# Patient Record
Sex: Male | Born: 1935 | Race: White | Hispanic: No | Smoking: Current every day smoker
Health system: Southern US, Community
[De-identification: ages and names within clinical notes are randomized; demographics above are authoritative.]

## PROBLEM LIST (undated history)

## (undated) DIAGNOSIS — Z923 Personal history of irradiation: Secondary | ICD-10-CM

## (undated) DIAGNOSIS — Z720 Tobacco use: Secondary | ICD-10-CM

## (undated) DIAGNOSIS — I2510 Atherosclerotic heart disease of native coronary artery without angina pectoris: Secondary | ICD-10-CM

## (undated) DIAGNOSIS — I714 Abdominal aortic aneurysm, without rupture, unspecified: Secondary | ICD-10-CM

## (undated) DIAGNOSIS — I739 Peripheral vascular disease, unspecified: Secondary | ICD-10-CM

## (undated) DIAGNOSIS — R590 Localized enlarged lymph nodes: Secondary | ICD-10-CM

## (undated) DIAGNOSIS — I6529 Occlusion and stenosis of unspecified carotid artery: Secondary | ICD-10-CM

## (undated) DIAGNOSIS — Z9289 Personal history of other medical treatment: Secondary | ICD-10-CM

## (undated) DIAGNOSIS — E785 Hyperlipidemia, unspecified: Secondary | ICD-10-CM

## (undated) DIAGNOSIS — I10 Essential (primary) hypertension: Secondary | ICD-10-CM

## (undated) DIAGNOSIS — I219 Acute myocardial infarction, unspecified: Secondary | ICD-10-CM

## (undated) DIAGNOSIS — J449 Chronic obstructive pulmonary disease, unspecified: Secondary | ICD-10-CM

## (undated) DIAGNOSIS — I701 Atherosclerosis of renal artery: Secondary | ICD-10-CM

## (undated) DIAGNOSIS — C801 Malignant (primary) neoplasm, unspecified: Secondary | ICD-10-CM

## (undated) DIAGNOSIS — E041 Nontoxic single thyroid nodule: Secondary | ICD-10-CM

## (undated) HISTORY — DX: Tobacco use: Z72.0

## (undated) HISTORY — DX: Personal history of other medical treatment: Z92.89

## (undated) HISTORY — DX: Peripheral vascular disease, unspecified: I73.9

## (undated) HISTORY — DX: Atherosclerotic heart disease of native coronary artery without angina pectoris: I25.10

## (undated) HISTORY — DX: Malignant (primary) neoplasm, unspecified: C80.1

## (undated) HISTORY — DX: Atherosclerosis of renal artery: I70.1

## (undated) HISTORY — DX: Localized enlarged lymph nodes: R59.0

## (undated) HISTORY — DX: Nontoxic single thyroid nodule: E04.1

## (undated) HISTORY — DX: Acute myocardial infarction, unspecified: I21.9

## (undated) HISTORY — PX: TONSILLECTOMY: SUR1361

## (undated) HISTORY — DX: Essential (primary) hypertension: I10

## (undated) HISTORY — DX: Abdominal aortic aneurysm, without rupture: I71.4

## (undated) HISTORY — DX: Occlusion and stenosis of unspecified carotid artery: I65.29

## (undated) HISTORY — PX: OTHER SURGICAL HISTORY: SHX169

## (undated) HISTORY — DX: Hyperlipidemia, unspecified: E78.5

## (undated) HISTORY — DX: Personal history of irradiation: Z92.3

## (undated) HISTORY — DX: Abdominal aortic aneurysm, without rupture, unspecified: I71.40

## (undated) HISTORY — DX: Chronic obstructive pulmonary disease, unspecified: J44.9

---

## 2004-06-01 DIAGNOSIS — I219 Acute myocardial infarction, unspecified: Secondary | ICD-10-CM

## 2004-06-01 HISTORY — DX: Acute myocardial infarction, unspecified: I21.9

## 2004-06-08 ENCOUNTER — Ambulatory Visit: Payer: Self-pay | Admitting: Cardiology

## 2004-06-08 ENCOUNTER — Inpatient Hospital Stay (HOSPITAL_COMMUNITY): Admission: EM | Admit: 2004-06-08 | Discharge: 2004-06-11 | Payer: Self-pay | Admitting: Emergency Medicine

## 2004-07-12 ENCOUNTER — Ambulatory Visit: Payer: Self-pay

## 2004-07-19 ENCOUNTER — Ambulatory Visit: Payer: Self-pay

## 2004-07-19 ENCOUNTER — Ambulatory Visit: Payer: Self-pay | Admitting: Internal Medicine

## 2004-09-07 ENCOUNTER — Ambulatory Visit: Payer: Self-pay

## 2004-09-23 ENCOUNTER — Ambulatory Visit: Payer: Self-pay | Admitting: Internal Medicine

## 2004-12-21 ENCOUNTER — Ambulatory Visit: Payer: Self-pay | Admitting: Internal Medicine

## 2005-02-24 ENCOUNTER — Ambulatory Visit: Payer: Self-pay | Admitting: Internal Medicine

## 2005-05-24 ENCOUNTER — Ambulatory Visit: Payer: Self-pay | Admitting: Internal Medicine

## 2005-09-02 ENCOUNTER — Ambulatory Visit: Payer: Self-pay | Admitting: Internal Medicine

## 2005-09-20 ENCOUNTER — Ambulatory Visit: Payer: Self-pay

## 2005-12-30 ENCOUNTER — Ambulatory Visit: Payer: Self-pay | Admitting: Internal Medicine

## 2006-07-21 ENCOUNTER — Ambulatory Visit: Payer: Self-pay | Admitting: Internal Medicine

## 2006-07-28 ENCOUNTER — Ambulatory Visit: Payer: Self-pay | Admitting: Internal Medicine

## 2006-10-23 ENCOUNTER — Ambulatory Visit: Payer: Self-pay

## 2007-01-08 ENCOUNTER — Ambulatory Visit: Payer: Self-pay | Admitting: Internal Medicine

## 2007-01-16 ENCOUNTER — Ambulatory Visit: Payer: Self-pay | Admitting: Internal Medicine

## 2007-01-26 ENCOUNTER — Ambulatory Visit: Payer: Self-pay | Admitting: Internal Medicine

## 2007-01-26 ENCOUNTER — Ambulatory Visit: Payer: Self-pay

## 2007-01-26 ENCOUNTER — Ambulatory Visit: Payer: Self-pay | Admitting: Cardiology

## 2007-01-26 LAB — CONVERTED CEMR LAB
ALT: 20 units/L (ref 0–53)
AST: 27 units/L (ref 0–37)
Albumin: 3.8 g/dL (ref 3.5–5.2)
Basophils Absolute: 0 10*3/uL (ref 0.0–0.1)
Bilirubin, Direct: 0.1 mg/dL (ref 0.0–0.3)
Calcium: 9.4 mg/dL (ref 8.4–10.5)
Chloride: 112 meq/L (ref 96–112)
Cholesterol: 118 mg/dL (ref 0–200)
Eosinophils Absolute: 0.2 10*3/uL (ref 0.0–0.6)
GFR calc Af Amer: 77 mL/min
GFR calc non Af Amer: 63 mL/min
Glucose, Bld: 99 mg/dL (ref 70–99)
HDL: 48.4 mg/dL (ref >39.0)
LDL Cholesterol: 58 mg/dL (ref 0–99)
Lymphocytes Relative: 36.1 % (ref 12.0–46.0)
MCHC: 33.6 g/dL (ref 30.0–36.0)
MCV: 97 fL (ref 78.0–100.0)
Monocytes Relative: 6.6 % (ref 3.0–11.0)
Neutro Abs: 4.6 10*3/uL (ref 1.4–7.7)
Platelets: 174 10*3/uL (ref 150–400)
RBC: 4.42 M/uL (ref 4.22–5.81)
Total CHOL/HDL Ratio: 2.4
Triglycerides: 57 mg/dL (ref 0–149)
VLDL: 11 mg/dL (ref 0–40)
aPTT: 28.9 s (ref 26.5–36.5)

## 2007-02-06 ENCOUNTER — Ambulatory Visit: Payer: Self-pay | Admitting: Internal Medicine

## 2007-02-08 ENCOUNTER — Ambulatory Visit (HOSPITAL_COMMUNITY): Admission: RE | Admit: 2007-02-08 | Discharge: 2007-02-08 | Payer: Self-pay | Admitting: Internal Medicine

## 2007-02-14 LAB — CBC WITH DIFFERENTIAL/PLATELET
BASO%: 0.5 % (ref 0.0–2.0)
MCHC: 35.1 g/dL (ref 32.0–35.9)
MONO#: 0.7 10*3/uL (ref 0.1–0.9)
RBC: 4.27 10*6/uL (ref 4.20–5.71)
RDW: 14.2 % (ref 11.2–14.6)
WBC: 7.5 10*3/uL (ref 4.0–10.0)
lymph#: 2.9 10*3/uL (ref 0.9–3.3)

## 2007-02-14 LAB — COMPREHENSIVE METABOLIC PANEL
ALT: 16 U/L (ref 0–53)
CO2: 29 mEq/L (ref 19–32)
Calcium: 9.6 mg/dL (ref 8.4–10.5)
Chloride: 106 mEq/L (ref 96–112)
Potassium: 4.2 mEq/L (ref 3.5–5.3)
Sodium: 143 mEq/L (ref 135–145)
Total Bilirubin: 0.4 mg/dL (ref 0.3–1.2)
Total Protein: 7 g/dL (ref 6.0–8.3)

## 2007-02-21 ENCOUNTER — Ambulatory Visit (HOSPITAL_COMMUNITY): Admission: RE | Admit: 2007-02-21 | Discharge: 2007-02-21 | Payer: Self-pay | Admitting: Internal Medicine

## 2007-02-27 ENCOUNTER — Ambulatory Visit: Payer: Self-pay | Admitting: Thoracic Surgery (Cardiothoracic Vascular Surgery)

## 2007-03-06 ENCOUNTER — Ambulatory Visit (HOSPITAL_COMMUNITY)
Admission: RE | Admit: 2007-03-06 | Discharge: 2007-03-06 | Payer: Self-pay | Admitting: Thoracic Surgery (Cardiothoracic Vascular Surgery)

## 2007-03-07 ENCOUNTER — Ambulatory Visit: Payer: Self-pay | Admitting: Thoracic Surgery (Cardiothoracic Vascular Surgery)

## 2007-03-13 ENCOUNTER — Encounter (INDEPENDENT_AMBULATORY_CARE_PROVIDER_SITE_OTHER): Payer: Self-pay | Admitting: Interventional Radiology

## 2007-03-13 ENCOUNTER — Other Ambulatory Visit: Admission: RE | Admit: 2007-03-13 | Discharge: 2007-03-13 | Payer: Self-pay | Admitting: Interventional Radiology

## 2007-03-13 ENCOUNTER — Encounter: Admission: RE | Admit: 2007-03-13 | Discharge: 2007-03-13 | Payer: Self-pay | Admitting: Internal Medicine

## 2007-03-13 HISTORY — PX: BIOPSY THYROID: PRO38

## 2007-03-16 ENCOUNTER — Ambulatory Visit: Admission: RE | Admit: 2007-03-16 | Discharge: 2007-06-14 | Payer: Self-pay | Admitting: Radiation Oncology

## 2007-03-27 ENCOUNTER — Ambulatory Visit: Payer: Self-pay | Admitting: Internal Medicine

## 2007-03-29 LAB — CBC WITH DIFFERENTIAL/PLATELET
BASO%: 0.3 % (ref 0.0–2.0)
LYMPH%: 40 % (ref 14.0–48.0)
MCHC: 34.7 g/dL (ref 32.0–35.9)
MONO#: 0.5 10*3/uL (ref 0.1–0.9)
Platelets: 196 10*3/uL (ref 145–400)
RBC: 4.37 10*6/uL (ref 4.20–5.71)
WBC: 7.3 10*3/uL (ref 4.0–10.0)

## 2007-03-29 LAB — COMPREHENSIVE METABOLIC PANEL
ALT: 15 U/L (ref 0–53)
Albumin: 4.2 g/dL (ref 3.5–5.2)
CO2: 25 mEq/L (ref 19–32)
Calcium: 9.5 mg/dL (ref 8.4–10.5)
Chloride: 105 mEq/L (ref 96–112)
Creatinine, Ser: 1.37 mg/dL (ref 0.40–1.50)
Potassium: 4.5 mEq/L (ref 3.5–5.3)
Sodium: 140 mEq/L (ref 135–145)
Total Protein: 7 g/dL (ref 6.0–8.3)

## 2007-04-27 ENCOUNTER — Ambulatory Visit: Payer: Self-pay | Admitting: Thoracic Surgery (Cardiothoracic Vascular Surgery)

## 2007-05-01 ENCOUNTER — Inpatient Hospital Stay (HOSPITAL_COMMUNITY)
Admission: RE | Admit: 2007-05-01 | Discharge: 2007-05-12 | Payer: Self-pay | Admitting: Thoracic Surgery (Cardiothoracic Vascular Surgery)

## 2007-05-01 ENCOUNTER — Encounter: Payer: Self-pay | Admitting: Thoracic Surgery (Cardiothoracic Vascular Surgery)

## 2007-05-01 ENCOUNTER — Ambulatory Visit: Payer: Self-pay | Admitting: Thoracic Surgery (Cardiothoracic Vascular Surgery)

## 2007-05-21 ENCOUNTER — Ambulatory Visit: Payer: Self-pay | Admitting: Thoracic Surgery (Cardiothoracic Vascular Surgery)

## 2007-05-21 ENCOUNTER — Encounter
Admission: RE | Admit: 2007-05-21 | Discharge: 2007-05-21 | Payer: Self-pay | Admitting: Thoracic Surgery (Cardiothoracic Vascular Surgery)

## 2007-05-25 ENCOUNTER — Ambulatory Visit: Payer: Self-pay | Admitting: Internal Medicine

## 2007-05-29 LAB — CBC WITH DIFFERENTIAL/PLATELET
BASO%: 0.5 % (ref 0.0–2.0)
EOS%: 5.3 % (ref 0.0–7.0)
MCH: 32.6 pg (ref 28.0–33.4)
MCHC: 35.1 g/dL (ref 32.0–35.9)
MONO#: 0.7 10*3/uL (ref 0.1–0.9)
RBC: 3.73 10*6/uL — ABNORMAL LOW (ref 4.20–5.71)
RDW: 13.9 % (ref 11.2–14.6)
WBC: 6.7 10*3/uL (ref 4.0–10.0)
lymph#: 1.6 10*3/uL (ref 0.9–3.3)

## 2007-05-29 LAB — COMPREHENSIVE METABOLIC PANEL
ALT: 19 U/L (ref 0–53)
AST: 20 U/L (ref 0–37)
CO2: 30 mEq/L (ref 19–32)
Calcium: 9.7 mg/dL (ref 8.4–10.5)
Chloride: 102 mEq/L (ref 96–112)
Sodium: 142 mEq/L (ref 135–145)
Total Protein: 7.2 g/dL (ref 6.0–8.3)

## 2007-06-08 ENCOUNTER — Ambulatory Visit: Payer: Self-pay | Admitting: Oncology

## 2007-06-11 LAB — COMPREHENSIVE METABOLIC PANEL
ALT: 16 U/L (ref 0–53)
Alkaline Phosphatase: 67 U/L (ref 39–117)
CO2: 31 mEq/L (ref 19–32)
Creatinine, Ser: 1.11 mg/dL (ref 0.40–1.50)
Glucose, Bld: 101 mg/dL — ABNORMAL HIGH (ref 70–99)
Sodium: 140 mEq/L (ref 135–145)
Total Bilirubin: 0.3 mg/dL (ref 0.3–1.2)
Total Protein: 6.6 g/dL (ref 6.0–8.3)

## 2007-06-11 LAB — CBC WITH DIFFERENTIAL (CANCER CENTER ONLY)
BASO%: 0.3 % (ref 0.0–2.0)
Eosinophils Absolute: 0.3 10*3/uL (ref 0.0–0.5)
MCH: 31.7 pg (ref 28.0–33.4)
MONO%: 8.4 % (ref 0.0–13.0)
NEUT#: 4 10*3/uL (ref 1.5–6.5)
Platelets: 266 10*3/uL (ref 145–400)
RBC: 3.96 10*6/uL — ABNORMAL LOW (ref 4.20–5.70)
RDW: 12.1 % (ref 10.5–14.6)
WBC: 6.7 10*3/uL (ref 4.0–10.0)

## 2007-06-26 LAB — CBC WITH DIFFERENTIAL (CANCER CENTER ONLY)
MCV: 92 fL (ref 82–98)
Platelets: 73 10*3/uL — ABNORMAL LOW (ref 145–400)
RBC: 3.64 10*6/uL — ABNORMAL LOW (ref 4.20–5.70)
WBC: 12.3 10*3/uL — ABNORMAL HIGH (ref 4.0–10.0)

## 2007-06-26 LAB — MANUAL DIFFERENTIAL (CHCC SATELLITE)
ANC (CHCC HP manual diff): 6.8 10*3/uL — ABNORMAL HIGH (ref 1.5–6.5)
Band Neutrophils: 16 % — ABNORMAL HIGH (ref 0–10)
Eos: 3 % (ref 0–7)
LYMPH: 30 % (ref 14–48)
MONO: 12 % (ref 0–13)
Metamyelocytes: 4 % — ABNORMAL HIGH (ref 0–0)
Platelet Morphology: NORMAL

## 2007-06-26 LAB — BASIC METABOLIC PANEL - CANCER CENTER ONLY
BUN, Bld: 16 mg/dL (ref 7–22)
CO2: 27 mEq/L (ref 18–33)
Glucose, Bld: 105 mg/dL (ref 73–118)
Potassium: 4.6 mEq/L (ref 3.3–4.7)
Sodium: 139 mEq/L (ref 128–145)

## 2007-07-03 ENCOUNTER — Ambulatory Visit: Payer: Self-pay | Admitting: Thoracic Surgery (Cardiothoracic Vascular Surgery)

## 2007-07-03 ENCOUNTER — Encounter
Admission: RE | Admit: 2007-07-03 | Discharge: 2007-07-03 | Payer: Self-pay | Admitting: Thoracic Surgery (Cardiothoracic Vascular Surgery)

## 2007-07-10 LAB — CMP (CANCER CENTER ONLY)
ALT(SGPT): 28 U/L (ref 10–47)
BUN, Bld: 13 mg/dL (ref 7–22)
CO2: 27 mEq/L (ref 18–33)
Calcium: 9.6 mg/dL (ref 8.0–10.3)
Chloride: 99 mEq/L (ref 98–108)
Creat: 1.1 mg/dl (ref 0.6–1.2)
Glucose, Bld: 269 mg/dL — ABNORMAL HIGH (ref 73–118)
Total Bilirubin: 0.4 mg/dl (ref 0.20–1.60)

## 2007-07-10 LAB — CBC WITH DIFFERENTIAL (CANCER CENTER ONLY)
BASO#: 0 10*3/uL (ref 0.0–0.2)
Eosinophils Absolute: 0.1 10*3/uL (ref 0.0–0.5)
HCT: 36.7 % — ABNORMAL LOW (ref 38.7–49.9)
HGB: 12.1 g/dL — ABNORMAL LOW (ref 13.0–17.1)
LYMPH#: 0.7 10*3/uL — ABNORMAL LOW (ref 0.9–3.3)
MONO#: 0.1 10*3/uL (ref 0.1–0.9)
NEUT#: 11 10*3/uL — ABNORMAL HIGH (ref 1.5–6.5)
NEUT%: 91.9 % — ABNORMAL HIGH (ref 40.0–80.0)
RBC: 3.93 10*6/uL — ABNORMAL LOW (ref 4.20–5.70)
WBC: 12 10*3/uL — ABNORMAL HIGH (ref 4.0–10.0)

## 2007-07-17 LAB — CBC WITH DIFFERENTIAL (CANCER CENTER ONLY)
HCT: 31.8 % — ABNORMAL LOW (ref 38.7–49.9)
HGB: 10.7 g/dL — ABNORMAL LOW (ref 13.0–17.1)
MCH: 31.1 pg (ref 28.0–33.4)
MCHC: 33.5 g/dL (ref 32.0–35.9)
RBC: 3.42 10*6/uL — ABNORMAL LOW (ref 4.20–5.70)

## 2007-07-17 LAB — BASIC METABOLIC PANEL - CANCER CENTER ONLY
CO2: 31 mEq/L (ref 18–33)
Calcium: 9.4 mg/dL (ref 8.0–10.3)
Chloride: 98 mEq/L (ref 98–108)
Glucose, Bld: 112 mg/dL (ref 73–118)
Potassium: 4.7 mEq/L (ref 3.3–4.7)
Sodium: 136 mEq/L (ref 128–145)

## 2007-07-17 LAB — MANUAL DIFFERENTIAL (CHCC SATELLITE)
BASO: 1 % (ref 0–2)
Eos: 2 % (ref 0–7)
LYMPH: 30 % (ref 14–48)
MONO: 18 % — ABNORMAL HIGH (ref 0–13)
Metamyelocytes: 2 % — ABNORMAL HIGH (ref 0–0)

## 2007-07-27 ENCOUNTER — Ambulatory Visit: Payer: Self-pay | Admitting: Oncology

## 2007-07-31 LAB — CMP (CANCER CENTER ONLY)
ALT(SGPT): 29 U/L (ref 10–47)
BUN, Bld: 17 mg/dL (ref 7–22)
CO2: 27 mEq/L (ref 18–33)
Calcium: 9.6 mg/dL (ref 8.0–10.3)
Chloride: 101 mEq/L (ref 98–108)
Creat: 0.9 mg/dl (ref 0.6–1.2)
Glucose, Bld: 266 mg/dL — ABNORMAL HIGH (ref 73–118)

## 2007-07-31 LAB — CBC WITH DIFFERENTIAL (CANCER CENTER ONLY)
Eosinophils Absolute: 0.1 10*3/uL (ref 0.0–0.5)
LYMPH%: 9.6 % — ABNORMAL LOW (ref 14.0–48.0)
MCV: 95 fL (ref 82–98)
MONO#: 0 10*3/uL — ABNORMAL LOW (ref 0.1–0.9)
NEUT#: 4.2 10*3/uL (ref 1.5–6.5)
Platelets: 284 10*3/uL (ref 145–400)
RBC: 3.99 10*6/uL — ABNORMAL LOW (ref 4.20–5.70)
WBC: 4.8 10*3/uL (ref 4.0–10.0)

## 2007-08-07 LAB — CBC WITH DIFFERENTIAL (CANCER CENTER ONLY)
Eosinophils Absolute: 0.1 10*3/uL (ref 0.0–0.5)
HCT: 34.7 % — ABNORMAL LOW (ref 38.7–49.9)
LYMPH%: 27 % (ref 14.0–48.0)
MCV: 96 fL (ref 82–98)
MONO#: 2.1 10*3/uL — ABNORMAL HIGH (ref 0.1–0.9)
NEUT%: 37.6 % — ABNORMAL LOW (ref 40.0–80.0)
RBC: 3.6 10*6/uL — ABNORMAL LOW (ref 4.20–5.70)
WBC: 6.4 10*3/uL (ref 4.0–10.0)

## 2007-08-07 LAB — BASIC METABOLIC PANEL - CANCER CENTER ONLY
CO2: 28 mEq/L (ref 18–33)
Calcium: 9.4 mg/dL (ref 8.0–10.3)
Chloride: 99 mEq/L (ref 98–108)
Potassium: 5.1 mEq/L — ABNORMAL HIGH (ref 3.3–4.7)
Sodium: 138 mEq/L (ref 128–145)

## 2007-08-21 LAB — CBC WITH DIFFERENTIAL (CANCER CENTER ONLY)
BASO%: 0.2 % (ref 0.0–2.0)
EOS%: 1 % (ref 0.0–7.0)
HCT: 36.3 % — ABNORMAL LOW (ref 38.7–49.9)
LYMPH%: 7.8 % — ABNORMAL LOW (ref 14.0–48.0)
MCHC: 33.9 g/dL (ref 32.0–35.9)
MCV: 96 fL (ref 82–98)
NEUT%: 90 % — ABNORMAL HIGH (ref 40.0–80.0)
RDW: 16.8 % — ABNORMAL HIGH (ref 10.5–14.6)

## 2007-08-21 LAB — CMP (CANCER CENTER ONLY)
ALT(SGPT): 22 U/L (ref 10–47)
CO2: 26 mEq/L (ref 18–33)
Calcium: 9.8 mg/dL (ref 8.0–10.3)
Chloride: 98 mEq/L (ref 98–108)
Creat: 1.1 mg/dl (ref 0.6–1.2)

## 2007-08-28 LAB — MANUAL DIFFERENTIAL (CHCC SATELLITE)
ANC (CHCC HP manual diff): 2.7 10*3/uL (ref 1.5–6.5)
Band Neutrophils: 13 % — ABNORMAL HIGH (ref 0–10)
Eos: 1 % (ref 0–7)
PLT EST ~~LOC~~: DECREASED
RBC Comments: NORMAL
SEG: 43 % (ref 40–75)

## 2007-08-28 LAB — CBC WITH DIFFERENTIAL (CANCER CENTER ONLY)
HCT: 32.9 % — ABNORMAL LOW (ref 38.7–49.9)
MCHC: 33.6 g/dL (ref 32.0–35.9)
RDW: 15.5 % — ABNORMAL HIGH (ref 10.5–14.6)

## 2007-08-28 LAB — BASIC METABOLIC PANEL - CANCER CENTER ONLY
CO2: 30 mEq/L (ref 18–33)
Calcium: 9.4 mg/dL (ref 8.0–10.3)
Sodium: 138 mEq/L (ref 128–145)

## 2007-09-24 ENCOUNTER — Ambulatory Visit: Payer: Self-pay | Admitting: Thoracic Surgery (Cardiothoracic Vascular Surgery)

## 2007-09-24 ENCOUNTER — Encounter
Admission: RE | Admit: 2007-09-24 | Discharge: 2007-09-24 | Payer: Self-pay | Admitting: Thoracic Surgery (Cardiothoracic Vascular Surgery)

## 2007-09-27 ENCOUNTER — Ambulatory Visit: Payer: Self-pay | Admitting: Oncology

## 2007-09-28 ENCOUNTER — Ambulatory Visit: Admission: RE | Admit: 2007-09-28 | Discharge: 2007-12-27 | Payer: Self-pay | Admitting: Radiation Oncology

## 2007-09-28 LAB — CBC WITH DIFFERENTIAL (CANCER CENTER ONLY)
Eosinophils Absolute: 0.2 10*3/uL (ref 0.0–0.5)
LYMPH#: 1.7 10*3/uL (ref 0.9–3.3)
MCH: 33.7 pg — ABNORMAL HIGH (ref 28.0–33.4)
MONO#: 0.3 10*3/uL (ref 0.1–0.9)
MONO%: 5.4 % (ref 0.0–13.0)
NEUT#: 2.5 10*3/uL (ref 1.5–6.5)
Platelets: 187 10*3/uL (ref 145–400)
RBC: 3.75 10*6/uL — ABNORMAL LOW (ref 4.20–5.70)
WBC: 4.7 10*3/uL (ref 4.0–10.0)

## 2007-09-28 LAB — CMP (CANCER CENTER ONLY)
ALT(SGPT): 19 U/L (ref 10–47)
CO2: 30 mEq/L (ref 18–33)
Calcium: 9.6 mg/dL (ref 8.0–10.3)
Chloride: 105 mEq/L (ref 98–108)
Creat: 1.2 mg/dl (ref 0.6–1.2)
Glucose, Bld: 182 mg/dL — ABNORMAL HIGH (ref 73–118)
Sodium: 148 mEq/L — ABNORMAL HIGH (ref 128–145)
Total Protein: 6.9 g/dL (ref 6.4–8.1)

## 2007-10-03 ENCOUNTER — Ambulatory Visit (HOSPITAL_COMMUNITY): Admission: RE | Admit: 2007-10-03 | Discharge: 2007-10-03 | Payer: Self-pay | Admitting: Radiation Oncology

## 2007-10-08 ENCOUNTER — Ambulatory Visit: Payer: Self-pay | Admitting: Internal Medicine

## 2007-10-08 ENCOUNTER — Ambulatory Visit: Payer: Self-pay

## 2007-10-09 LAB — CBC WITH DIFFERENTIAL (CANCER CENTER ONLY)
BASO#: 0 10*3/uL (ref 0.0–0.2)
BASO%: 0.6 % (ref 0.0–2.0)
EOS%: 5.8 % (ref 0.0–7.0)
HCT: 39.5 % (ref 38.7–49.9)
LYMPH#: 1.4 10*3/uL (ref 0.9–3.3)
LYMPH%: 29.1 % (ref 14.0–48.0)
MCH: 32.8 pg (ref 28.0–33.4)
MCHC: 33.2 g/dL (ref 32.0–35.9)
MCV: 99 fL — ABNORMAL HIGH (ref 82–98)
MONO%: 6.3 % (ref 0.0–13.0)
NEUT%: 58.2 % (ref 40.0–80.0)
RDW: 11.9 % (ref 10.5–14.6)

## 2007-10-09 LAB — CMP (CANCER CENTER ONLY)
ALT(SGPT): 19 U/L (ref 10–47)
CO2: 30 mEq/L (ref 18–33)
Calcium: 9.3 mg/dL (ref 8.0–10.3)
Chloride: 103 mEq/L (ref 98–108)
Creat: 1.1 mg/dl (ref 0.6–1.2)
Glucose, Bld: 119 mg/dL — ABNORMAL HIGH (ref 73–118)

## 2007-10-16 ENCOUNTER — Ambulatory Visit: Payer: Self-pay

## 2007-10-24 LAB — BASIC METABOLIC PANEL - CANCER CENTER ONLY
CO2: 30 mEq/L (ref 18–33)
Calcium: 9.8 mg/dL (ref 8.0–10.3)
Potassium: 4.8 mEq/L — ABNORMAL HIGH (ref 3.3–4.7)
Sodium: 143 mEq/L (ref 128–145)

## 2007-10-24 LAB — CBC WITH DIFFERENTIAL (CANCER CENTER ONLY)
BASO%: 0.5 % (ref 0.0–2.0)
EOS%: 3.5 % (ref 0.0–7.0)
MCH: 33.2 pg (ref 28.0–33.4)
MCHC: 33.9 g/dL (ref 32.0–35.9)
MONO%: 7.3 % (ref 0.0–13.0)
NEUT#: 2.4 10*3/uL (ref 1.5–6.5)
Platelets: 186 10*3/uL (ref 145–400)
RDW: 10.9 % (ref 10.5–14.6)

## 2007-11-21 ENCOUNTER — Ambulatory Visit: Payer: Self-pay | Admitting: Oncology

## 2007-11-22 LAB — CBC WITH DIFFERENTIAL (CANCER CENTER ONLY)
BASO%: 0.3 % (ref 0.0–2.0)
EOS%: 4.2 % (ref 0.0–7.0)
LYMPH#: 0.7 10*3/uL — ABNORMAL LOW (ref 0.9–3.3)
MCH: 32.2 pg (ref 28.0–33.4)
MCHC: 33.9 g/dL (ref 32.0–35.9)
MONO%: 7.8 % (ref 0.0–13.0)
NEUT#: 3.1 10*3/uL (ref 1.5–6.5)
Platelets: 181 10*3/uL (ref 145–400)
RDW: 11.5 % (ref 10.5–14.6)

## 2007-11-22 LAB — BASIC METABOLIC PANEL - CANCER CENTER ONLY
CO2: 30 mEq/L (ref 18–33)
Calcium: 9.5 mg/dL (ref 8.0–10.3)
Chloride: 102 mEq/L (ref 98–108)
Potassium: 4.6 mEq/L (ref 3.3–4.7)
Sodium: 145 mEq/L (ref 128–145)

## 2008-01-25 ENCOUNTER — Ambulatory Visit: Payer: Self-pay | Admitting: Thoracic Surgery (Cardiothoracic Vascular Surgery)

## 2008-01-25 ENCOUNTER — Encounter
Admission: RE | Admit: 2008-01-25 | Discharge: 2008-01-25 | Payer: Self-pay | Admitting: Thoracic Surgery (Cardiothoracic Vascular Surgery)

## 2008-01-30 ENCOUNTER — Ambulatory Visit: Payer: Self-pay | Admitting: Internal Medicine

## 2008-01-30 LAB — CONVERTED CEMR LAB
ALT: 17 units/L (ref 0–53)
CO2: 29 meq/L (ref 19–32)
Calcium: 9 mg/dL (ref 8.4–10.5)
Creatinine, Ser: 1.3 mg/dL (ref 0.4–1.5)
GFR calc Af Amer: 70 mL/min
Glucose, Bld: 93 mg/dL (ref 70–99)
HDL: 42.1 mg/dL (ref >39.0)
Sodium: 144 meq/L (ref 135–145)
Total CHOL/HDL Ratio: 2.1
Total Protein: 6.4 g/dL (ref 6.0–8.3)
Triglycerides: 43 mg/dL (ref 0–149)
VLDL: 9 mg/dL (ref 0–40)

## 2008-02-13 ENCOUNTER — Ambulatory Visit: Payer: Self-pay | Admitting: Oncology

## 2008-02-14 ENCOUNTER — Ambulatory Visit: Payer: Self-pay

## 2008-02-14 LAB — CBC WITH DIFFERENTIAL (CANCER CENTER ONLY)
BASO#: 0 10*3/uL (ref 0.0–0.2)
BASO%: 0.4 % (ref 0.0–2.0)
HCT: 38.3 % — ABNORMAL LOW (ref 38.7–49.9)
HGB: 13.1 g/dL (ref 13.0–17.1)
LYMPH%: 32.6 % (ref 14.0–48.0)
MCV: 94 fL (ref 82–98)
MONO#: 0.3 10*3/uL (ref 0.1–0.9)
NEUT%: 55.8 % (ref 40.0–80.0)
RDW: 12.5 % (ref 10.5–14.6)
WBC: 3.9 10*3/uL — ABNORMAL LOW (ref 4.0–10.0)

## 2008-02-14 LAB — COMPREHENSIVE METABOLIC PANEL
Alkaline Phosphatase: 56 U/L (ref 39–117)
Glucose, Bld: 73 mg/dL (ref 70–99)
Sodium: 142 mEq/L (ref 135–145)
Total Bilirubin: 0.7 mg/dL (ref 0.3–1.2)
Total Protein: 6.4 g/dL (ref 6.0–8.3)

## 2008-04-09 ENCOUNTER — Ambulatory Visit: Payer: Self-pay | Admitting: Oncology

## 2008-04-11 LAB — COMPREHENSIVE METABOLIC PANEL
Albumin: 4.6 g/dL (ref 3.5–5.2)
Alkaline Phosphatase: 63 U/L (ref 39–117)
CO2: 25 mEq/L (ref 19–32)
Glucose, Bld: 89 mg/dL (ref 70–99)
Potassium: 4.9 mEq/L (ref 3.5–5.3)
Sodium: 141 mEq/L (ref 135–145)
Total Protein: 7.3 g/dL (ref 6.0–8.3)

## 2008-04-11 LAB — CBC WITH DIFFERENTIAL (CANCER CENTER ONLY)
Eosinophils Absolute: 0.2 10*3/uL (ref 0.0–0.5)
HCT: 41.5 % (ref 38.7–49.9)
LYMPH%: 29.7 % (ref 14.0–48.0)
MCV: 95 fL (ref 82–98)
MONO#: 0.4 10*3/uL (ref 0.1–0.9)
NEUT%: 59.3 % (ref 40.0–80.0)
Platelets: 184 10*3/uL (ref 145–400)
RBC: 4.36 10*6/uL (ref 4.20–5.70)
WBC: 5.2 10*3/uL (ref 4.0–10.0)

## 2008-10-09 ENCOUNTER — Ambulatory Visit: Payer: Self-pay

## 2008-10-09 ENCOUNTER — Encounter: Payer: Self-pay | Admitting: Internal Medicine

## 2008-11-12 ENCOUNTER — Ambulatory Visit: Payer: Self-pay | Admitting: Oncology

## 2008-12-11 ENCOUNTER — Encounter: Payer: Self-pay | Admitting: Internal Medicine

## 2008-12-11 LAB — CBC WITH DIFFERENTIAL (CANCER CENTER ONLY)
Eosinophils Absolute: 0.2 10*3/uL (ref 0.0–0.5)
MONO#: 0.4 10*3/uL (ref 0.1–0.9)
NEUT#: 2.7 10*3/uL (ref 1.5–6.5)
Platelets: 198 10*3/uL (ref 145–400)
RBC: 4.21 10*6/uL (ref 4.20–5.70)
WBC: 5.1 10*3/uL (ref 4.0–10.0)

## 2008-12-11 LAB — CMP (CANCER CENTER ONLY)
BUN, Bld: 13 mg/dL (ref 7–22)
CO2: 30 mEq/L (ref 18–33)
Creat: 1.3 mg/dl — ABNORMAL HIGH (ref 0.6–1.2)
Glucose, Bld: 80 mg/dL (ref 73–118)
Total Bilirubin: 0.6 mg/dl (ref 0.20–1.60)

## 2008-12-17 ENCOUNTER — Ambulatory Visit (HOSPITAL_COMMUNITY): Admission: RE | Admit: 2008-12-17 | Discharge: 2008-12-17 | Payer: Self-pay | Admitting: Oncology

## 2009-03-04 ENCOUNTER — Ambulatory Visit: Payer: Self-pay | Admitting: Internal Medicine

## 2009-03-28 DIAGNOSIS — Z8639 Personal history of other endocrine, nutritional and metabolic disease: Secondary | ICD-10-CM

## 2009-03-28 DIAGNOSIS — F17210 Nicotine dependence, cigarettes, uncomplicated: Secondary | ICD-10-CM | POA: Insufficient documentation

## 2009-03-28 DIAGNOSIS — Z862 Personal history of diseases of the blood and blood-forming organs and certain disorders involving the immune mechanism: Secondary | ICD-10-CM | POA: Insufficient documentation

## 2009-03-28 DIAGNOSIS — Z8679 Personal history of other diseases of the circulatory system: Secondary | ICD-10-CM | POA: Insufficient documentation

## 2009-03-28 DIAGNOSIS — C3490 Malignant neoplasm of unspecified part of unspecified bronchus or lung: Secondary | ICD-10-CM | POA: Insufficient documentation

## 2009-03-28 DIAGNOSIS — Z9189 Other specified personal risk factors, not elsewhere classified: Secondary | ICD-10-CM | POA: Insufficient documentation

## 2009-03-28 DIAGNOSIS — R599 Enlarged lymph nodes, unspecified: Secondary | ICD-10-CM | POA: Insufficient documentation

## 2009-03-28 DIAGNOSIS — I739 Peripheral vascular disease, unspecified: Secondary | ICD-10-CM

## 2009-03-28 DIAGNOSIS — I10 Essential (primary) hypertension: Secondary | ICD-10-CM | POA: Insufficient documentation

## 2009-03-28 DIAGNOSIS — J329 Chronic sinusitis, unspecified: Secondary | ICD-10-CM | POA: Insufficient documentation

## 2009-03-28 DIAGNOSIS — J449 Chronic obstructive pulmonary disease, unspecified: Secondary | ICD-10-CM | POA: Insufficient documentation

## 2009-03-28 DIAGNOSIS — E782 Mixed hyperlipidemia: Secondary | ICD-10-CM | POA: Insufficient documentation

## 2009-08-05 ENCOUNTER — Encounter: Payer: Self-pay | Admitting: Internal Medicine

## 2009-11-30 ENCOUNTER — Ambulatory Visit: Payer: Self-pay | Admitting: Oncology

## 2009-12-04 LAB — CBC WITH DIFFERENTIAL (CANCER CENTER ONLY)
Eosinophils Absolute: 0.2 10*3/uL (ref 0.0–0.5)
HGB: 15.3 g/dL (ref 13.0–17.1)
LYMPH#: 1.8 10*3/uL (ref 0.9–3.3)
MCH: 33.6 pg — ABNORMAL HIGH (ref 28.0–33.4)
MONO%: 7 % (ref 0.0–13.0)
NEUT#: 4.2 10*3/uL (ref 1.5–6.5)
Platelets: 208 10*3/uL (ref 145–400)
RBC: 4.55 10*6/uL (ref 4.20–5.70)
WBC: 6.7 10*3/uL (ref 4.0–10.0)

## 2009-12-04 LAB — CMP (CANCER CENTER ONLY)
AST: 27 U/L (ref 11–38)
Albumin: 4 g/dL (ref 3.3–5.5)
Alkaline Phosphatase: 63 U/L (ref 26–84)
Calcium: 9.5 mg/dL (ref 8.0–10.3)
Chloride: 97 mEq/L — ABNORMAL LOW (ref 98–108)
Glucose, Bld: 81 mg/dL (ref 73–118)
Potassium: 4.3 mEq/L (ref 3.3–4.7)
Sodium: 137 mEq/L (ref 128–145)
Total Protein: 6.9 g/dL (ref 6.4–8.1)

## 2009-12-08 ENCOUNTER — Encounter: Payer: Self-pay | Admitting: Internal Medicine

## 2009-12-10 ENCOUNTER — Ambulatory Visit: Payer: Self-pay | Admitting: Internal Medicine

## 2009-12-11 ENCOUNTER — Encounter (INDEPENDENT_AMBULATORY_CARE_PROVIDER_SITE_OTHER): Payer: Self-pay | Admitting: *Deleted

## 2009-12-16 ENCOUNTER — Encounter: Payer: Self-pay | Admitting: Internal Medicine

## 2009-12-24 ENCOUNTER — Ambulatory Visit (HOSPITAL_COMMUNITY): Admission: RE | Admit: 2009-12-24 | Discharge: 2009-12-24 | Payer: Self-pay | Admitting: Oncology

## 2010-01-26 ENCOUNTER — Telehealth: Payer: Self-pay | Admitting: Internal Medicine

## 2010-03-02 ENCOUNTER — Telehealth: Payer: Self-pay | Admitting: Internal Medicine

## 2010-03-09 ENCOUNTER — Encounter: Payer: Self-pay | Admitting: Internal Medicine

## 2010-06-26 IMAGING — CT CT ANGIO CHEST
2 of 6 series · 16 of 36 positions shown · IV contrast ([ID] OMNI 300)
Comparison: 01/26/2007 chest CT and 01/08/2007 abdomen CT

CTA CHEST

CLINICAL DATA: Lung cancer.  Abdominal aortic aneurysm

CT ANGIOGRAPHY CHEST AND ABDOMEN
TECHNIQUE: Multidetector CT imaging of the chest and abdomen was
performed using the standard protocol during bolus administration
of intravenous contrast. Multiplanar CT image reconstructions were
obtained to evaluate the vascular anatomy.
Contrast: 100 ml Hmnipaque-YKK

[Series 5: angio · axial · 0.64mm/px · z∈[-200,+190]mm · 13 of 184 slices shown]
[im 14/184  lung]
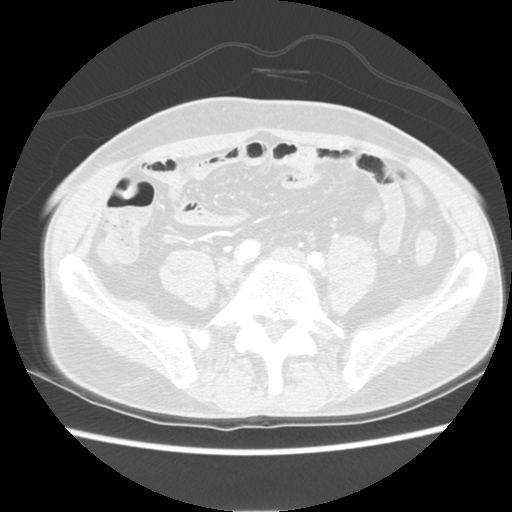
[im 27/184  mediastinal]
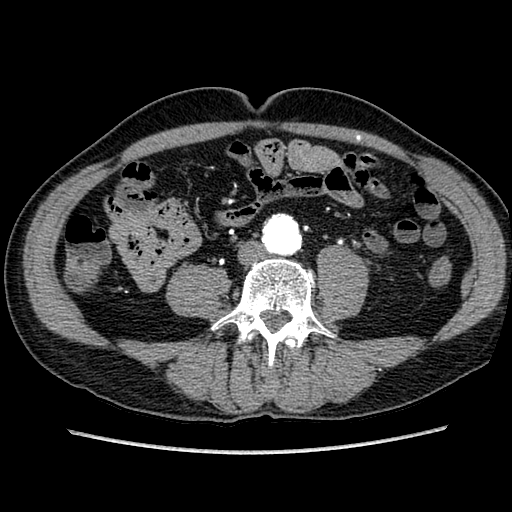
[im 40/184  lung]
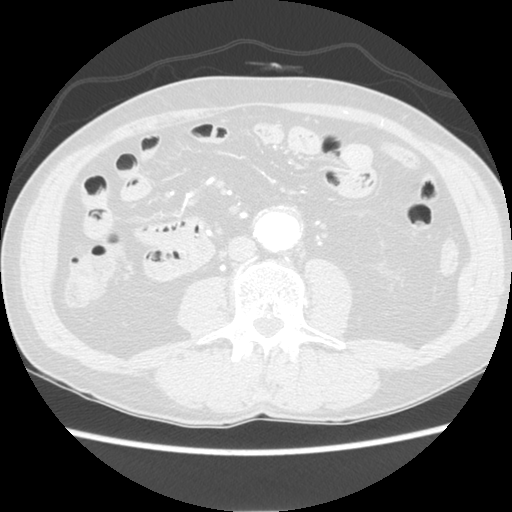
[im 53/184  mediastinal]
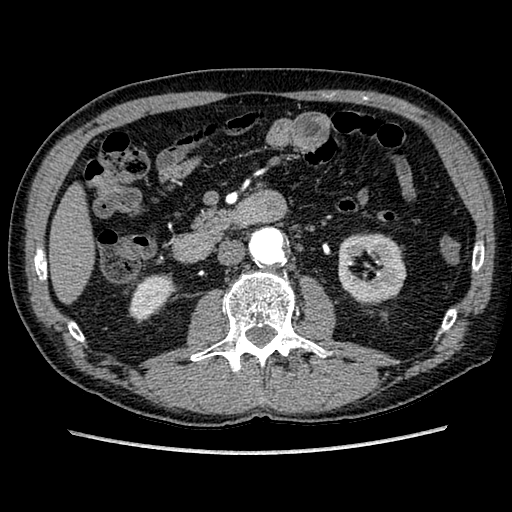
[im 66/184  lung]
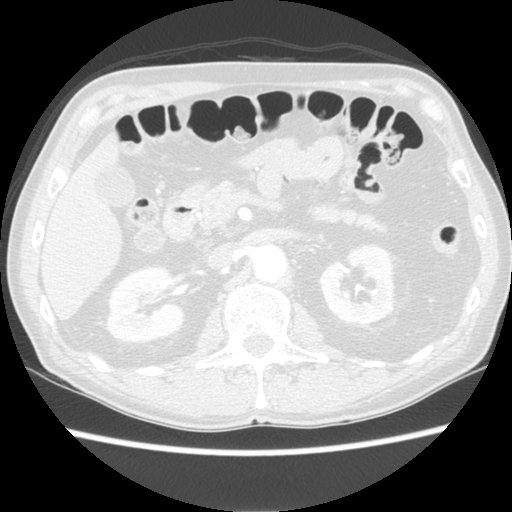
[im 79/184  mediastinal]
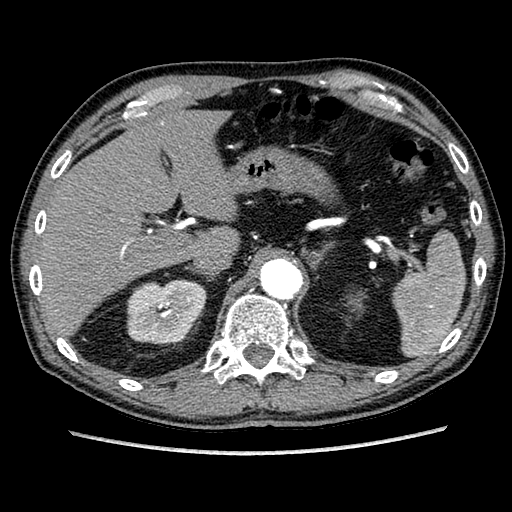
[im 92/184  lung]
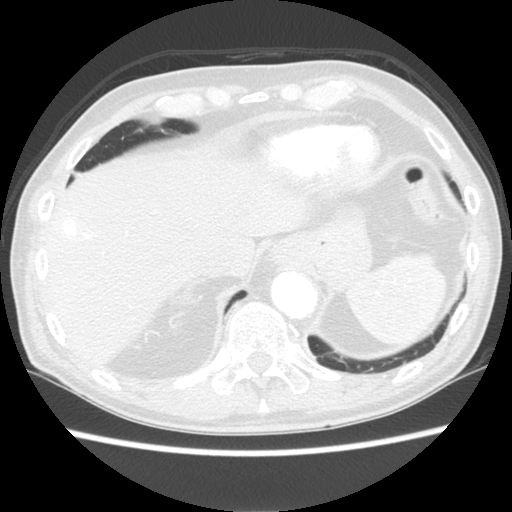
[im 105/184  mediastinal]
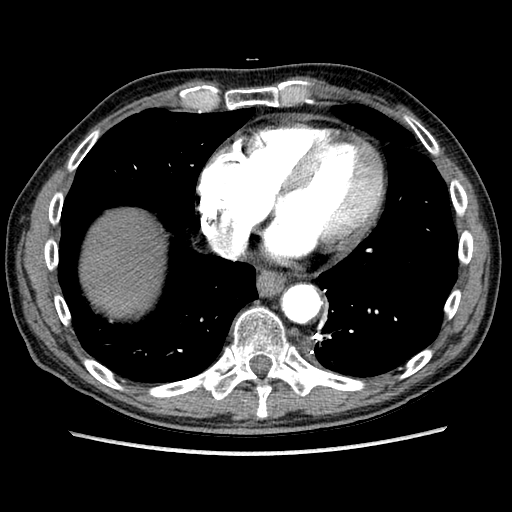
[im 118/184  lung]
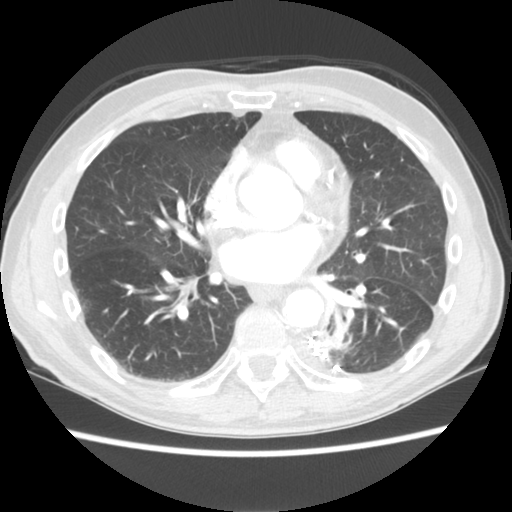
[im 131/184  mediastinal]
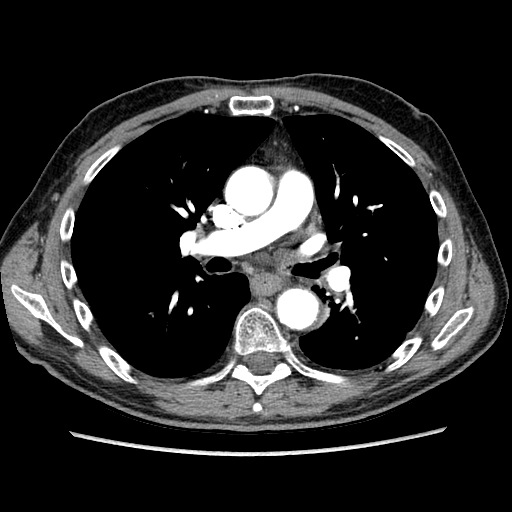
[im 144/184  lung]
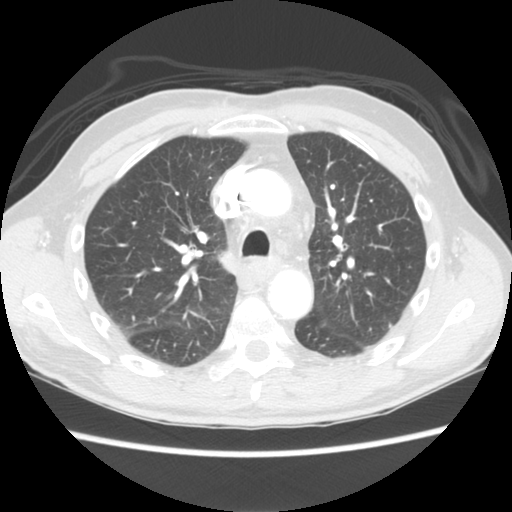
[im 157/184  mediastinal]
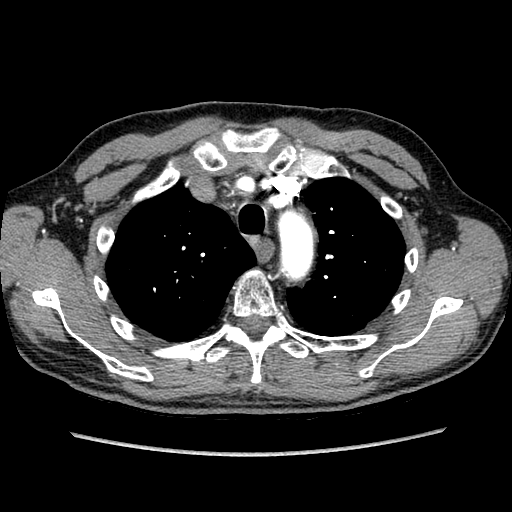
[im 170/184  lung]
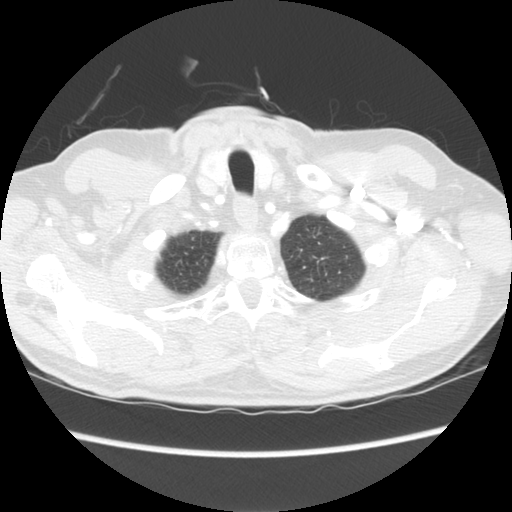

[Series 602: sagittal body · sagittal · 0.90mm/px · 3 of 133 slices shown]
[im 27/133  mediastinal]
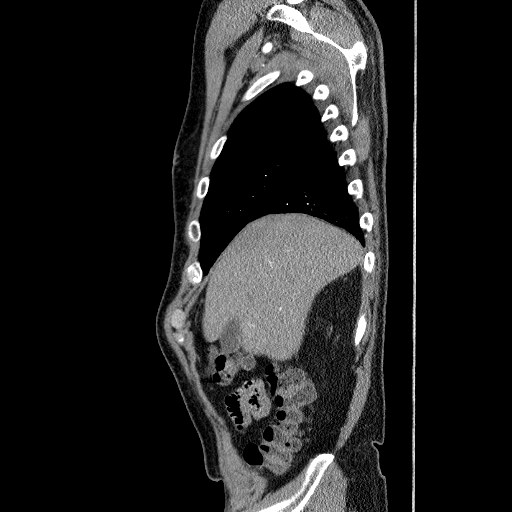
[im 53/133  mediastinal]
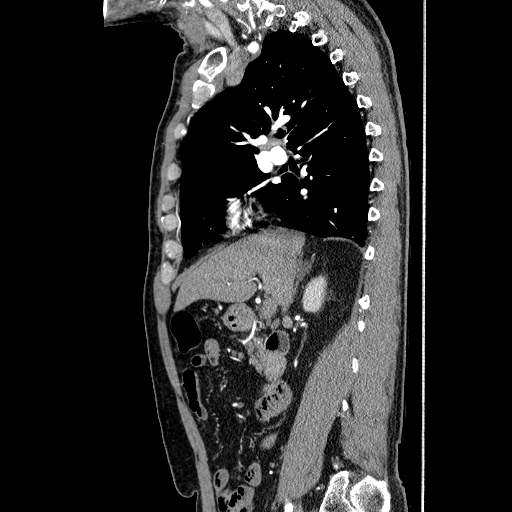
[im 80/133  mediastinal]
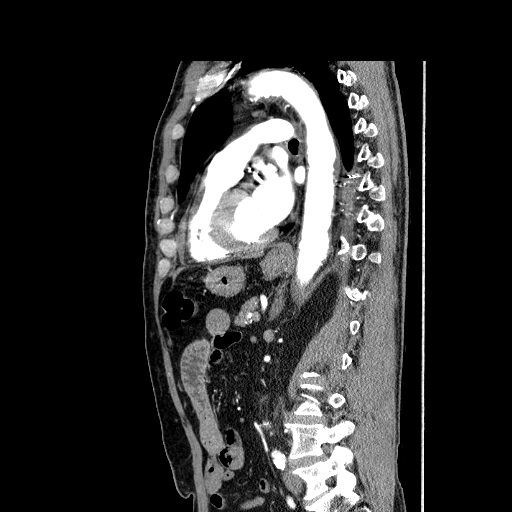

[16 of 36 positions shown; findings below may reference images not displayed]

FINDINGS: Precontrast imaging shows no hyperdense crescent in the
thoracic aorta to suggest intramural hematoma.

11 mm right thyroid nodule measured 13 mm previously.  Ascending
thoracic aorta measures 3.0 cm in maximum diameter.  The descending
thoracic aorta measures 2.6 cm.  There is mild atherosclerotic
irregularity in the thoracic aorta, more so in the descending
segment than in the ascending aorta.  The for aortic arch vessel
branches opacify symmetrically.  Coronary artery calcification is
evident.  The heart is upper normal to mildly increased in size.
There is trace pericardial effusion.  No pleural effusion.

There is no axillary lymphadenopathy.  12 mm subcarinal lymph nodes
seen previously has decreased, measuring 7 mm short axis today.  No
mediastinal or hilar lymphadenopathy.

Lung windows show emphysema.  6 mm right middle lobe pulmonary
nodule on image 38 measured 7 mm previously.  The 3 mm right upper
lobe pulmonary nodule on image 28 is stable.  The patient has had
interval surgery in the left lung and the spiculated 14 mm lesion
seen previously is no longer evident.  There is now collapse /
scarring in the posteromedial left lung base with brachytherapy
seeds evident.  No new or progressive findings are identified
within either lung.

Bone windows are unremarkable.
IMPRESSION: Status post left lung surgery with resection of the 14 mm
spiculated nodule seen previously.  Curvilinear opacity in the same
region today is felt to be related to postoperative / treatment
scarring.  Continued attention on follow-up films is recommended.

The the two tiny right lung nodules are unchanged in the interval.
These could also be assessed for stability on follow-up studies.

CTA ABDOMEN
FINDINGS: Irregular fusiform aneurysmal dilatation of the
abdominal aorta is again noted.  The maximum obtainable diameter of
the abdominal aorta is in its infrarenal segment where it measures
3.4 x 3.0 cm.  This compares to 3.3 cm on the study from
01/08/2007.  There is no evidence for a dissection flap in the
abdominal aorta.  Atherosclerotic calcification is seen scattered
along the length of the abdominal aorta.  The celiac axis and
superior mesenteric artery appear widely patent.  The patient is
noted to have an accessory renal artery on the left.  Inferior
mesenteric artery opacifies normally.

Several areas of peripheral arterial phase enhancement are seen in
the liver.  These were present previously and the most dominant of
the lesions seen previously in the lateral segment of the left
liver is inapparent today.  This may be secondary to very slight
difference in bolus timing and the findings could be related to
vascular malformations, flash filling hemangiomas, adenoma, or F N
H.  Metastatic disease is a possibility, but considered less
likely.

The spleen, stomach, and adrenal glands are unremarkable.  Duodenal
diverticulum noted.  Pancreas is unremarkable.  Gallbladder has
normal imaging features.  No focal abnormalities seen in either
kidney.

There is no abdominal lymphadenopathy.  No intraperitoneal free
fluid.
IMPRESSION: No substantial interval change in the abdominal aortic aneurysm.

Several hypervascular peripheral liver lesions.  The dominant
lesion seen previously is inapparent today.  Several other lesions
in the right liver are stable.  Please see full report above.

## 2010-08-22 ENCOUNTER — Encounter: Payer: Self-pay | Admitting: Radiation Oncology

## 2010-08-29 LAB — CONVERTED CEMR LAB
ALT: 11 units/L (ref 0–53)
AST: 22 units/L (ref 0–37)
Alkaline Phosphatase: 56 units/L (ref 39–117)
Bilirubin, Direct: 0.1 mg/dL (ref 0.0–0.3)
Chloride: 102 meq/L (ref 96–112)
Cholesterol: 141 mg/dL (ref 0–200)
GFR calc non Af Amer: 82.14 mL/min (ref >60)
LDL Cholesterol: 82 mg/dL (ref 0–99)
Potassium: 4.5 meq/L (ref 3.5–5.1)
Sodium: 140 meq/L (ref 135–145)
Total Bilirubin: 0.6 mg/dL (ref 0.3–1.2)
Total CHOL/HDL Ratio: 3
VLDL: 12 mg/dL (ref 0.0–40.0)

## 2010-08-31 NOTE — Letter (Signed)
Summary: [facility] - Lipid  [facility], Main Office  [address]   [place], [place] [address]   Phone: [phone]  Fax: [phone]     Dec 16, 2009 MRN: [id number]   Derek Jordan [address] [place], [place]  [address]   Dear [name],  We have reviewed your cholesterol results.  They are as follows:     Total Cholesterol:    141 (Desirable: less than 200)       HDL  Cholesterol:     47.20  (Desirable: greater than 40 for men and 50 for women)       LDL Cholesterol:       82  (Desirable: less than 100 for low risk and less than 70 for moderate to high risk)       Triglycerides:       60.0  (Desirable: less than 150)  Our recommendations include:  Looks ok LDL slightly up, resume Niaspan and Simvastatin   Call our office at the number listed above if you have any questions.  Lowering your LDL cholesterol is important, but it is only one of a large number of "risk factors" that may indicate that you are at risk for heart disease, stroke or other complications of hardening of the arteries.  Other risk factors include:   A.  Cigarette Smoking* B.  High Blood Pressure* C.  Obesity* D.   Low HDL Cholesterol (see yours above)* E.   Diabetes Mellitus (higher risk if your is uncontrolled) F.  Family history of premature heart disease G.  Previous history of stroke or cardiovascular disease    *These are risk factors YOU HAVE CONTROL OVER.  For more information, visit .  There is now evidence that lowering the TOTAL CHOLESTEROL AND LDL CHOLESTEROL can reduce the risk of heart disease.  The American Heart Association recommends the following guidelines for the treatment of elevated cholesterol:  1.  If there is now current heart disease and less than two risk factors, TOTAL CHOLESTEROL should be less than 200 and LDL CHOLESTEROL should be less than 100. 2.  If there is current heart disease or two or more risk factors, TOTAL CHOLESTEROL should be less than 200 and  LDL CHOLESTEROL should be less than 70.  A diet low in cholesterol, saturated fat, and calories is the cornerstone of treatment for elevated cholesterol.  Cessation of smoking and exercise are also important in the management of elevated cholesterol and preventing vascular disease.  Studies have shown that 30 to 60 minutes of physical activity most days can help lower blood pressure, lower cholesterol, and keep your weight at a healthy level.  Drug therapy is used when cholesterol levels do not respond to therapeutic lifestyle changes (smoking cessation, diet, and exercise) and remains unacceptably high.  If medication is started, it is important to have you levels checked periodically to evaluate the need for further treatment options.  Thank you,    [facility] Team

## 2010-08-31 NOTE — Progress Notes (Signed)
Summary: drug interaction  Medications Added LIPITOR 40 MG TABS (ATORVASTATIN CALCIUM) Take one tablet by mouth daily.       Phone Note Other Incoming Call back at [phone]   Caller: Susan/[facility] Summary of Call: drug interaction simvastatin and amilodipine, request call back Initial call taken by: Migdalia Dk,  January 26, 2010 12:55 PM  Follow-up for Phone Call        switch simva to lipitor 40. Dolores Patty, MD, [facility]  January 26, 2010 3:14 PM  Darl Pikes aware Meredith Staggers, RN  January 26, 2010 6:06 PM     New/Updated Medications: LIPITOR 40 MG TABS (ATORVASTATIN CALCIUM) Take one tablet by mouth daily. Prescriptions: LIPITOR 40 MG TABS (ATORVASTATIN CALCIUM) Take one tablet by mouth daily.  #30 x 12   Entered by:   Meredith Staggers, RN   Authorized by:   Dolores Patty, MD, [facility]   Signed by:   Meredith Staggers, RN on 01/26/2010   Method used:   Telephoned to ...       [facility] [phone]* (retail)       [address]       [place], [place]  [address]       Ph: [phone] or [phone]       Fax: [phone]   RxID:   [id number]

## 2010-08-31 NOTE — Letter (Signed)
Summary: cx'd test  [facility], Main Office  [address]   [place], [place] [address]   Phone: [phone]  Fax: [phone]        March 09, 2010 MRN: [id number]    YAAKOV SAINDON [address] [place], [place]  [address]    Dear Mr. TUGWELL,  Our records indicate that you have cancelled your abdominal and carotid ultrasounds.  It is Dr Bensimhon's recommendation that you have this test done.  Please contact our office as soon as possible to reschedule.     Sincerely,  Meredith Staggers, RN Arvilla Meres, MD This letter has been electronically signed by your physician.

## 2010-08-31 NOTE — Letter (Signed)
[facility]   Imported By: Marylou Mccoy 01/19/2010 12:40:36  _____________________________________________________________________  External Attachment:    Type:   Image     Comment:   External Document

## 2010-08-31 NOTE — Progress Notes (Signed)
Summary: pt wants to change plavix   Phone Note Call from Patient Call back at Home Phone [phone]   Caller: Patient Reason for Call: Talk to Nurse, Talk to Doctor Summary of Call: pt wants to change his plavix to propastatin because he bruises so bad on plavix and the cost Initial call taken by: Omer Jack,  March 02, 2010 8:17 AM  Follow-up for Phone Call        pt called b/c the Lipitor we put him on last month is too expensive, pt has [employer], gave pt phone # to call and get co-pay card, he is going to call Meredith Staggers, RN  March 02, 2010 9:22 AM

## 2010-08-31 NOTE — Assessment & Plan Note (Signed)
Summary: rov per pt call/lg  Medications Added NIASPAN 1000 MG CR-TABS (NIACIN (ANTIHYPERLIPIDEMIC)) take one tablet at bedtime --- been out of for 3 mths SIMVASTATIN 80 MG TABS (SIMVASTATIN) Take one tablet by mouth daily at bedtime --- been out a few days ASPIRIN EC 325 MG TBEC (ASPIRIN) Take one tablet by mouth daily NITROSTAT 0.4 MG SUBL (NITROGLYCERIN) 1 tablet under tongue at onset of chest pain; you may repeat every 5 minutes for up to 3 doses.      Allergies Added: NKDA  Visit Type:  Follow-up  CC:  no compliants.  History of Present Illness: Derek Jordan is a delightful 77 year old male with a history of single-vessel coronary artery disease, status post acute inferior wall myocardial infarction treated with Cypher drug-eluting stent to the mid right coronary artery in 2005.  His LV function was normal.  There was mild noncritical disease elsewhere.  He also has a history of lung CA (s/p resection/chemo/XRT), hyperlipidemia, severe peripheral vascular disease with a small infrarenal abdominal aneurysm, carotid stenosis and greater than 60% left renal artery stenosis by ultrasound.  Returns for two year f/u. Denies CP or SOB feels he is doing well. 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 No TIA symptoms. Still smoking 1/2 ppd. Has pain in his legs if he walks more than 1/2 mile (walks slowly). BP well controlled occasional orthostasis  Last carotid 8/10: R 40-59% L 0-39%         ab [place]: AAA 3.3 cm x 3.3 cm  Current Medications (verified): 1)  Lisinopril 40 Mg Tabs (Lisinopril) .... Take One Tablet By Mouth Daily 2)  Niaspan 1000 Mg Cr-Tabs (Niacin (Antihyperlipidemic)) .... Take One Tablet At Bedtime --- Been Out of For 3 Mths 3)  Amlodipine Besylate 10 Mg Tabs (Amlodipine Besylate) .... Take One Tablet By Mouth Daily 4)  Simvastatin 80 Mg Tabs (Simvastatin) .... Take One Tablet By Mouth Daily At Bedtime --- Been Out A Few Days 5)  Carvedilol 25 Mg Tabs (Carvedilol) .... Take One Tablet By Mouth  Twice A Day 6)  Aspirin Ec 325 Mg Tbec (Aspirin) .... Take One Tablet By Mouth Daily 7)  Nitrostat 0.4 Mg Subl (Nitroglycerin) .[name].. 1 Tablet Under Tongue At Onset of Chest Pain; You May Repeat Every 5 Minutes For Up To 3 Doses.  Allergies (verified): No Known Drug Allergies  Past History:  Past Medical History: Last updated: 03/28/2009 Current Problems:  THORACOSCOPIC SURGICAL,WEDGE RESECTION (ICD-V64.42) MYOCARDIAL INFARCTION, ACUTE,STENT PLACEMENT (ICD-410.90) TOBACCO ABUSE, HX OF (ICD-V15.82) MEDIASTINAL LYMPHADENOPATHY (ICD-785.6) RADIATION THERAPY, HX OF (ICD-V15.9) THYROID NODULE, HX OF (ICD-V12.2) ADENOCARCINOMA, LUNG (ICD-162.9) CAROTID ARTERY STENOSIS, BILATERAL (ICD-433.10) CHRONIC OBSTRUCTIVE PULMONARY DISEASE (ICD-496) HYPERTENSION (ICD-401.9) ABDOMINAL AORTIC ANEURYSM, HX OF (ICD-V12.50) PVD (ICD-443.9) MIXED HYPERLIPIDEMIA (ICD-272.2) CORONARY ATHEROSCLEROSIS NATIVE CORONARY ARTERY (ICD-414.01) SINUSITIS (ICD-473.9)  Review of Systems       As per HPI and past medical history; otherwise all systems negative.   Vital Signs:  Patient profile:   77 year old male Height:      65 inches Weight:      128 pounds BMI:     21.38 Pulse rate:   78 / minute BP sitting:   116 / 64  (left arm) Cuff size:   regular  Vitals Entered By: Hardin Negus, RMA (Dec 10, 2009 8:07 AM)  Physical Exam  General:  well appearing. no resp difficulty. smells like smoke HEENT: normal Neck: supple. no JVD. Carotids 2+ bilat; no bruits. No lymphadenopathy or thryomegaly appreciated. Cor: PMI nondisplaced. Regular rate &  rhythm. No rubs, gallops, murmur. Lungs: clear with decreased air movement throughout Abdomen: soft, nontender, nondistended. No hepatosplenomegaly. No bruits or masses. Good bowel sounds. Extremities: no cyanosis, clubbing, rash, edema Neuro: alert & orientedx3, cranial nerves grossly intact. moves all 4 extremities w/o difficulty. affect  pleasant    Impression & Recommendations:  Problem # 1:  CORONARY ARTERY DISEASE, S/P PTCA (ICD-414.9) Stable. No evidence of ischemia. Continue current regimen.  Problem # 2:  CAROTID ARTERY STENOSIS, BILATERAL (ICD-433.10) Stable. Asx. Due for repeat u/s in August.   Problem # 3:  MIXED HYPERLIPIDEMIA (ICD-272.2) Check lipids today. Goal LDL < 70. Has been out of Niaspan will restart.  Problem # 4:  HYPERTENSION (ICD-401.9) Blood pressure well controlled. Continue current regimen.  Problem # 5:  ABDOMINAL AORTIC ANEURYSM, HX OF (ICD-V12.50) This is small. Due for rescan in 8/10.   Problem # 6:  TOBACCO ABUSE, HX OF (ICD-V15.82) Counseld on need to quit. Not interested in quitting at this time.   Other Orders: EKG w/ Interpretation (93000) Carotid Duplex (Carotid Duplex) Abdominal Aorta Duplex (Abd Aorta Duplex) TLB-BMP (Basic Metabolic Panel-BMET) (80048-METABOL) TLB-Hepatic/Liver Function Pnl (80076-HEPATIC) TLB-Lipid Panel (80061-LIPID)  Patient Instructions: 1)  Labs today 2)  Your physician has requested that you have an abdominal aorta duplex. During this test, an ultrasound is used to evaluate the aorta. Allow 30 minutes for this exam. Do not eat after midnight the day before and avoid carbonated beverages. There are no restrictions or special instructions.  Needs in August. 3)  Your physician has requested that you have a carotid duplex. This test is an ultrasound of the carotid arteries in your neck. It looks at blood flow through these arteries that supply the brain with blood. Allow one hour for this exam. There are no restrictions or special instructions.  Needs in August. 4)  Follow up in 1 year  Appended Document: rov per pt call/lg    Clinical Lists Changes  Medications: Changed medication from NIASPAN 1000 MG CR-TABS (NIACIN (ANTIHYPERLIPIDEMIC)) take one tablet at bedtime --- been out of for 3 mths to NIASPAN 1000 MG CR-TABS (NIACIN (ANTIHYPERLIPIDEMIC))  take one tablet at bedtime - Signed Changed medication from SIMVASTATIN 80 MG TABS (SIMVASTATIN) Take one tablet by mouth daily at bedtime --- been out a few days to SIMVASTATIN 80 MG TABS (SIMVASTATIN) Take one tablet by mouth daily at bedtime - Signed Rx of NIASPAN 1000 MG CR-TABS (NIACIN (ANTIHYPERLIPIDEMIC)) take one tablet at bedtime;  #30 x 12;  Signed;  Entered by: Meredith Staggers, RN;  Authorized by: Dolores Patty, MD, [facility];  Method used: Electronically to [facility] [phone]*, [address], [place], [place], [place]  [address], Ph: [phone] or [phone], Fax: [phone] Rx of SIMVASTATIN 80 MG TABS (SIMVASTATIN) Take one tablet by mouth daily at bedtime;  #30 x 12;  Signed;  Entered by: Meredith Staggers, RN;  Authorized by: Dolores Patty, MD, [facility];  Method used: Electronically to [facility]  [facility] [phone]*, [address], [place], [place], [place]  [address], Ph: [phone] or [phone], Fax: [phone]    Prescriptions: SIMVASTATIN 80 MG TABS (SIMVASTATIN) Take one tablet by mouth daily at bedtime  #30 x 12   Entered by:   Meredith Staggers, RN   Authorized by:   Dolores Patty, MD, [facility]   Signed by:   Meredith Staggers, RN on 12/10/2009   Method used:   Electronically to  [facility]S  [facility] [phone]* (retail)       [address]       [place], [place]  [address]       Ph: [phone] or [phone]       Fax: [phone]   RxID:   [id number] NIASPAN 1000 MG CR-TABS (NIACIN (ANTIHYPERLIPIDEMIC)) take one tablet at bedtime  #30 x 12   Entered by:   Meredith Staggers, RN   Authorized by:   Dolores Patty, MD, [facility]   Signed by:   Meredith Staggers, RN on 12/10/2009   Method used:   Electronically to        [facility]S  [facility] [phone]* (retail)       [address]       [place], [place]  [address]       Ph: [phone] or [phone]       Fax: [phone]   RxID:    [id number]

## 2010-08-31 NOTE — Letter (Signed)
[facility]   Imported By: Roderic Ovens 09/02/2009 11:18:45  _____________________________________________________________________  External Attachment:    Type:   Image     Comment:   External Document

## 2010-08-31 NOTE — Letter (Signed)
Summary: Generic Letter  [facility], Main Office  [address]   [place], [place] [address]   Phone: [phone]  Fax: [phone]    12/11/2009  JAMAR WEATHERALL [address] [place]  [address]  Dear Mr. KNOEDLER,  You are scheduled to have Aorta Ultrasound/Carotid Ultrasound on 12/31/09 @ 10am. We need to change your appointments to August 4th @ 10am per your insurance company. If you have any question please call [phone] ask to speak to [place]. Sorry for the inconvenience.       Sincerely,   Merita Norton Lloyd-Fate

## 2010-12-11 ENCOUNTER — Encounter: Payer: Self-pay | Admitting: Internal Medicine

## 2010-12-14 ENCOUNTER — Ambulatory Visit (INDEPENDENT_AMBULATORY_CARE_PROVIDER_SITE_OTHER): Payer: Medicare Other | Admitting: Internal Medicine

## 2010-12-14 ENCOUNTER — Encounter: Payer: Self-pay | Admitting: Internal Medicine

## 2010-12-14 VITALS — BP 98/46 | HR 72 | Ht 65.0 in | Wt 123.0 lb

## 2010-12-14 DIAGNOSIS — I259 Chronic ischemic heart disease, unspecified: Secondary | ICD-10-CM

## 2010-12-14 DIAGNOSIS — Z8679 Personal history of other diseases of the circulatory system: Secondary | ICD-10-CM

## 2010-12-14 DIAGNOSIS — I10 Essential (primary) hypertension: Secondary | ICD-10-CM

## 2010-12-14 DIAGNOSIS — Z87891 Personal history of nicotine dependence: Secondary | ICD-10-CM

## 2010-12-14 DIAGNOSIS — I6529 Occlusion and stenosis of unspecified carotid artery: Secondary | ICD-10-CM

## 2010-12-14 DIAGNOSIS — E782 Mixed hyperlipidemia: Secondary | ICD-10-CM

## 2010-12-14 LAB — LIPID PANEL
Cholesterol: 88 mg/dL (ref 0–200)
HDL: 37.1 mg/dL — ABNORMAL LOW (ref >39.00)
VLDL: 9.4 mg/dL (ref 0.0–40.0)

## 2010-12-14 LAB — BASIC METABOLIC PANEL
BUN: 16 mg/dL (ref 6–23)
Calcium: 8.7 mg/dL (ref 8.4–10.5)
GFR: 68.45 mL/min (ref >60.00)
Glucose, Bld: 90 mg/dL (ref 70–99)
Sodium: 136 mEq/L (ref 135–145)

## 2010-12-14 LAB — HEPATIC FUNCTION PANEL: Albumin: 3.4 g/dL — ABNORMAL LOW (ref 3.5–5.2)

## 2010-12-14 MED ORDER — AMLODIPINE BESYLATE 10 MG PO TABS: ORAL_TABLET | ORAL | Status: DC

## 2010-12-14 NOTE — Assessment & Plan Note (Signed)
Due for repeat u/s. Will schedule.

## 2010-12-14 NOTE — Assessment & Plan Note (Signed)
[facility]                            CARDIOLOGY OFFICE NOTE   NAME:Derek Jordan, Derek Jordan                       MRN:          [id number]  DATE:01/30/2008                            DOB:          01-Jul-1933    PRIMARY CARE PHYSICIAN:  Lianne Bushy, M.D.   He is also seen out at [facility].   INTERVAL HISTORY:  Derek Jordan is a delightful 77 year old male with a history  of single-vessel coronary artery disease, status post acute inferior  wall myocardial infarction treated with Cypher drug-eluting stent to the  mid right coronary artery in 2005.  His LV function was normal.  There  was mild noncritical disease elsewhere.  He also has a history of  hypertension, hyperlipidemia, severe peripheral vascular disease with a  small infrarenal abdominal aneurysm.  Carotid stenosis and greater than  60% left renal artery stenosis by ultrasound.  Finally, he has a history  of COPD and was diagnosed with lung cancer last year.  He is status post  partial resection of his left lung and XRT and radiation.   He returns today for followup.  He says he is doing great.  He continues  to work on the farm.  He has not had any chest pain and denies any  shortness of breath.  He does note that he gets claudication on Jordan  legs, this is stable.  He does not feel like it is limiting him too  much.  He has not had any ulcerations.  No heart failure.  He has been  compliant with all of his medications.  He was recently seen by Oncology  and there has been no evidence of recurrence to this point.  Unfortunately, he continues to smoke a couple cigarettes a day.   CURRENT MEDICATIONS:  1. Carvedilol 25 b.i.d.  2. Lisinopril 40 a day.  3. Simvastatin 80 a day.  4. Niaspan 1000 a day.  5. Amlodipine 10 a day.  6. Albuterol inhaler as needed.  7. Zyrtec.  8. Aspirin 81 a day.   PHYSICAL EXAMINATION:  GENERAL:  He is in no acute distress.  Ambulates  around the clinic without any  respiratory difficulty.  VITAL SIGNS:  Blood pressure is 103/59, heart rate 68, and weight is  136.  HEENT:  He is a dentulous, otherwise normal.  NECK:  Supple.  No JVD.  Carotid are 2+ bilaterally without any audible  bruits.  There is no lymphadenopathy or thyromegaly.  CARDIAC:  PMI is nondisplaced.  He has regular rate and rhythm with no  S4 no murmur.  LUNGS:  Clear.  Decreased air movement throughout.  No wheezes or rales.  ABDOMEN:  Soft, nontender, and nondistended.  No hepatosplenomegaly.  No  bruits appreciated.  No masses.  Good bowel sounds.  EXTREMITIES:  Warm with no cyanosis or edema.  There is perhaps trace  clubbing.  His right DP pulse is 1+, his left is nonpalpable.  SKIN:  There is no ulceration or rash.  NEURO:  Alert and oriented x3.  Cranial nerves II through XII are  intact.  Moves all four extremities without difficulty.  Affect is  pleasant.   EKG shows sinus rhythm at a rate of 68.  No ST-T wave abnormalities.   ASSESSMENT:  1. Coronary artery disease, stable.  No evidence of ischemia.      Continue current therapy.  2. Hypertension.  Blood pressure looks great.  He does have renal      artery stenosis by ultrasound.  We elected to manage this      medically.  3. Hyperlipidemia.  Goal LDL is less than 70.  We will check his      lipids today.  4. Peripheral arterial disease.  He is due for his carotid ultrasound      in the near future.  He will also get yearly ultrasounds to follow      his abdominal aneurysm.  I once again discussed with him the need      to stop smoking as well as the need for a graded exercise program      to deal with his claudication.   DISPOSITION:  We will see him back in 6 months.     Bevelyn Buckles. Bensimhon, MD  Electronically Signed    DRB/MedQ  DD: 01/30/2008  DT: 01/30/2008  Job #: [id number]   cc:   Drue Second, MD

## 2010-12-14 NOTE — Op Note (Signed)
NAMECOURTLAND, REAS                ACCOUNT NO.:  [id number]   MEDICAL RECORD NO.:  [id number]          PATIENT TYPE:  INP   LOCATION:  3310                         FACILITY:  [facility]   PHYSICIAN:  Salvatore Decent. Dorris Fetch, M.D.DATE OF BIRTH:  03/07/1933   DATE OF PROCEDURE:  05/01/2007  DATE OF DISCHARGE:                               OPERATIVE REPORT   PREOPERATIVE DIAGNOSIS:  Left lower lobe mass.   POSTOPERATIVE DIAGNOSIS:  Stage I adenocarcinoma, left lower lobe   PROCEDURE:  Bronchoscopy, left video assisted thoracoscopic surgery,  wedge resection, placement of radioactive seeds.   SURGEON:  Salvatore Decent. Dorris Fetch, M.D.   ASSISTANT:  Rowe Clack, P.A.-C.   ANESTHESIA:  General.   FINDINGS:  Bronchoscopy:  Thick clear secretions, normal anatomy.  No  endobronchial lesions.  VATS:  Adhesions laterally of the left lower  lobe to the chest wall, not in the area of the tumor, puckering of the  visceral pleura with approximately 1.5 cm mass in the posterior medial  aspect of the left lower lobe.  No definitive lymph nodes inferior  pulmonary ligament.  Subcarinal nodes grossly benign.   CLINICAL NOTE:  Derek Jordan is a 89 old gentleman with a history of heavy  tobacco abuse and chronic obstructive pulmonary disease.  He recently  was found to have a left lower lobe nodule.  A PET CT showed  hypermetabolic activity in the nodule itself.  There was no evidence of  regional or distant metastases.  The patient was advised to undergo  excisional biopsy with a wedge resection.  He is not a candidate for a  lobectomy due to impaired pulmonary function, particularly his MPV and  VLCO.  The option of radioactive seeds for possible decrease in local  recurrence rate was discussed in detail with the patient by both myself  and Dr. Kathrynn Running.  He elected to proceed with that option.   OPERATIVE NOTE:  Mr. Moody was brought to the preop holding area on  May 01, 2007.  There lines were  placed by anesthesia for  intravenous access and arterial blood pressure monitoring and  intravenous antibiotics were administered.  PAS hose were placed for DVT  prophylaxis.  He was taken to the operating room, anesthetized and  intubated with a single-lumen endotracheal tube.  PAS hose were in place  for DVT prophylaxis.  Flexible fiberoptic bronchoscopy was performed via  the endotracheal tube.  There were some thick, clear secretions and no  endobronchial lesions and normal endobronchial anatomy.   The patient then was reintubated with double-lumen endotracheal tube.  He was placed in a right lateral decubitus position.  The left chest was  prepped, draped usual fashion.  Single lung ventilation of right lung  was carried out.  The patient tolerated this well throughout the  procedure.  Isolation of the lung was intermittent but sufficient.  An  incision was made in approximately seventh intercostal space in  midaxillary lines.  It was carried through the skin, subcutaneous  tissue.  The skin was entered bluntly using hemostat.  A port was placed  through  the incision and a thoracoscope was placed through the port.  Additional incisions were made in the anterior axillary line.  This was  approximately a 4 cm long utility thoracotomy incision.  The posterior  port incision was also made just below the tip of the scapula. There  were adhesions of the left lower lobe to the chest wall.  These were  taken down with electrocautery.  These were thin adhesions and  avascular, not in the region of the mass.  Exploration of the left lower  lobe revealed a palpable 1.5 cm mass in the posterior medial aspect of  the left lower lobe.  There was puckering of the visceral pleura.  A  wide wedge resection was performed with Echelon 60 stapler after  mobilizing the inferior pulmonary ligament.  There were no definite  lymph nodes in the inferior pulmonary ligament.  The specimen was placed  in the  endoscopic retrieval bag and removed through the utility  incision.  The wedge resection was inspected.  There was at least a 3 cm  gross margin.  Specimen was sent for frozen section which returned  adenocarcinoma.  Margins were clear.   The area where the inferior pulmonary ligament tissue was still adjacent  to the lung was then resected with Echelon 60 stapler.  This appeared it  might have lymphatic tissue in it.  Closer inspection after removal,  there were no clear lymph nodes and this was sent for separate pathology  and additional margin.  Inspection of the inferior ligament revealed no  significant nodes.  Tissue was sampled.  The pleura was incised  laterally exposing the subcarinal nodes.  These were taken for permanent  section appeared to be normal grossly.  The radioactive seeds implanted  in Vicryl mesh then were placed into the incision and over the staple  lines centered at the site closes to the tumor and sutured in place with  3-0 Vicryl sutures.  Gelfoam was placed over the aorta to separate it  from the radioactive seeds.  A 28-French chest tube was placed in the  original port incision.  The lung was reinflated. There was good  expansion of lung.  The remaining incisions were closed with 0 Vicryl  fascial sutures, 2-0 Vicryl subcutaneous sutures and 3-0 Vicryl  subcuticular sutures.  The patient was extubated in the operating room  and taken to recovery in good condition.      Salvatore Decent Dorris Fetch, M.D.  Electronically Signed     SCH/MEDQ  D:  05/01/2007  T:  05/02/2007  Job:  [id number]   cc:   Lianne Bushy, M.D.  Bevelyn Buckles. Bensimhon, MD  Artist Pais. Kathrynn Running, M.D.  Lajuana Matte, MD

## 2010-12-14 NOTE — Assessment & Plan Note (Signed)
[facility]                            CARDIOLOGY OFFICE NOTE   NAME:Lienhard, CAETANO OBERHAUS                       MRN:          [id number]  DATE:01/16/2007                            DOB:          [birth year]    PRIMARY CARE PHYSICIAN:  Dr. Lianne Bushy.   INTERVAL HISTORY:  Mr. Ginyard is a delightful 77 year old male with a  history of single vessel coronary artery disease, status post acute  inferior wall myocardial infarction, treated with cipher drug-eluting  stent to mid right coronary artery in 2005. Left ventricular function  was normal. There was noncritical coronary disease elsewhere. He also  has a history of hypertension, hyperlipidemia, peripheral vascular  disease with a small infrarenal abdominal aortic aneurysm. He has COPD  with ongoing tobacco use.   He presents today for routine followup. He is doing well. He denies  chest pain, or shortness of breath. No claudication. He says he can walk  about a half a mile before he has to stop and really stops because his  knees and not claudication. He underwent a CT scan to follow up on his  abdominal aortic aneurysm recently. This showed a maximal diameter of  3.3 cm. There was a dual left renal artery system, in the lower dominant  vessel there appeared to be an 85% ostial stenosis. There was also a  question of left external iliac stenosis. Finally there was a 14 mm  spiculated lesion in left lower lobe of his lung suggestive of  bronchogenic carcinoma. He denies weight loss. He says he has been  coughing up some green sputum but this has been unchanged. No fevers or  chills.   CURRENT MEDICATIONS:  1. Claritin 10 a day.  2. Carvedilol 25 b.i.d.  3. Lisinopril 40 a day.  4. Aspirin 325 a day.  5. Simvastatin 80 a day.  6. Niaspan 1000 a day.  7. Amlodipine 10 mg a day.  8. Albuterol inhaler.   On physical exam, he ambulates around the clinic without any respiratory  difficultly. Overall  doing well. Blood pressure is 112/76, heart rate  68, weight is 140 which is unchanged.  HEENT: He is edentulous otherwise normal.  NECK: Supple. No JVD. Carotids are 2 + bilaterally without any bruits.  There is no lymphadenopathy or thyromegaly.  CARDIAC: Regular rate and rhythm. No murmurs, rubs, or gallops.  LUNGS: Clear with poor air movement throughout. No wheezes or rales.  ABDOMEN: Soft, nontender, nondistended. No hepatosplenomegaly. No  bruits. No masses. Good bowel sounds.  EXTREMITIES: Warm with no cyanosis, clubbing, or edema. Distal pulses  are 1 + bilaterally. There is no ulceration, no rash.  NEURO: Alert and oriented x3. Cranial nerves II-XII are intact. Moves  all 4 extremities without difficultly. Affect is pleasant.   ASSESSMENT/PLAN:  1. Coronary artery disease, status post previous myocardial      infarction. He is doing well. He is asymptomatic. Continue current      regimen.  2. Hypertension, well controlled.  3. Hyperlipidemia, he is on a good regimen, we will follow up his  cholesterol today.  4. Left lower lobe lung lesion. This is concerning for carcinoma. I      have discussed this with him as well as the need to stop smoking.      We will get a dedicated chest CT and have him follow up with either      pulmonary or the oncologist.  5. Peripheral arterial disease, he has a small triple A, as well as      iliac stenosis and left renal artery stenosis. He is on an ACE      inhibitor. As well as a statin. This does not seem to be overly      symptomatic at this point. I once again reinforce the need for      smoking cessation. I suspect he will need PV consult at some point.      We will also get carotid ultrasounds and will recheck his ABIs.     Bevelyn Buckles. Bensimhon, MD  Electronically Signed    DRB/MedQ  DD: 01/16/2007  DT: 01/16/2007  Job #: [id number]   cc:   Lianne Bushy, M.D.

## 2010-12-14 NOTE — Assessment & Plan Note (Signed)
No evidence of ischemia. Continue current regimen.   

## 2010-12-14 NOTE — Letter (Signed)
May 21, 2007   Lianne Bushy, M.D.  [address]  [place]  [place] [address]   Re:  [name], [name]                DOB:  [birth year]   Dear Dr. Purnell Shoemaker:   I saw your patient, Derek Jordan, back in the office today.  He is, as  you know, 77 year old gentleman with a history of heavy tobacco abuse  and COPD.  He was found to have a left lower lobe nodule.  I did a wedge  resection, and along with Dr. Margaretmary Dys, placed radioactive seeds  at the site of the wedge resection on September 30th.  We also did some  lymph node biopsies at the time of surgery and all the nodes grossly  appeared benign.  He subsequently was found to have metastatic  adenocarcinoma in a level 7 lymph node which changed his staging to a  IIIA.  He has an appointment to see Dr. Si Gaul next week to  discuss possible chemotherapy and will also be following up with Dr.  Kathrynn Running to see if any additional radiation therapy would be appropriate  in this setting.  From a surgical standpoint, Derek Jordan wounds are  healing well.  He is still on 1 L nasal cannula oxygen but hopefully  should be able to wean off that in a relatively short period of time.  Please do not hesitate to contact me if I can provide you with any  further information or assistance.   Sincerely,   Salvatore Decent. Dorris Fetch, M.D.  Electronically Signed   SCH/MEDQ  D:  05/21/2007  T:  05/22/2007  Job:  [id number]

## 2010-12-14 NOTE — Discharge Summary (Signed)
NAMEWARRICK, LLERA                ACCOUNT NO.:  [id number]   MEDICAL RECORD NO.:  [id number]          PATIENT TYPE:  INP   LOCATION:  2007                         FACILITY:  [facility]   PHYSICIAN:  Salvatore Decent. Dorris Fetch, M.D.DATE OF BIRTH:  11/19/1932   DATE OF ADMISSION:  05/01/2007  DATE OF DISCHARGE:                               DISCHARGE SUMMARY   FINAL DIAGNOSIS:  1. Left lower lobe stage I adenocarcinoma, T2, N2, Mx.   IN-HOSPITAL DIAGNOSIS.:  Postoperative bronchitis.   SECONDARY DIAGNOSES:  1. Hypertension.  2. Hyperlipidemia.  3. Coronary artery disease, status post myocardial infarction, status      post stent placement.  4. Peripheral vascular disease.  5. infrarenal abdominal aortic aneurysm.  6. Chronic obstructive pulmonary disease.   OPERATIONS AND PROCEDURES:  Bronchoscopy with left video-assisted  thoracoscopic surgery with wedge resection and placement of radioactive  seeds.   THE PATIENT'S HISTORY AND PHYSICAL AND HOSPITAL COURSE:  Mr. Derek Jordan is a  74-year gentleman with a history of heavy tobacco abuse and chronic  obstructive pulmonary disease.  He recently was found to have a left  lower lobe nodule.  PET scan showed hypermetabolic activity in the  nodule itself.  There was no evidence of regional or distant metastasis.  The patient was advised to undergo excisional biopsy with a wedge  resection.  The patient was not a candidate for lobectomy due to  impaired pulmonary function, particularly his MPV and DLCO.  The option  of radioactive seeds for possible decrease in local recurrence rate was  discussed in detail with the patient by Dr. Dorris Fetch as well as Dr.  Kathrynn Running.  Dr. Dorris Fetch discussed with the patient risks and benefits.  The patient acknowledged understanding and agreed to proceed.  Surgery  was scheduled for May 01, 2007.  For details of the patient's past  medical history and physical exam, please see dictated H&P.   The patient  was taken to the operating room May 01, 2007, where he  underwent bronchoscopy with left video-assisted thoracoscopic surgery  with wedge resection and placement of radioactive seeds.  The patient  tolerated this procedure well and was transferred to the intensive care  unit in stable condition.  Postoperatively the patient was noted to be  hemodynamically stable.  He was extubated following surgery.  Post  extubation the patient noted to be alert and oriented x4, neuro intact.  During the patient's postoperative course, he had daily chest x-rays  obtained.  Postop day #1, the patient's x-ray showed bilateral  atelectasis with subcu air on the left.  He had no air leak from chest  tube noted.  He was encouraged to use his incentive spirometer.  The  patient's chest tube was placed to water seal.  Follow-up chest x-ray  the following day was stable with no pneumothorax.  The patient's chest  tube was discontinued postop day #2.  Follow-up chest x-ray post  discontinuation of chest tube showed no pneumothorax.  Postop day #3,  the patient's chest x-ray showed bilateral atelectasis.  The patient had  developed a temperature of 102.3 degrees  Fahrenheit.  The patient was  started on IV Rocephin empirically.  The patient continued to have low-  grade fevers and sputum culture and sensitivity obtained and showed gram-  positive cocci in clusters.  The patient's Rocephin was changed to IV  Avelox.  The patient's temperature improved and he remained afebrile.  The patient's IV Avelox was discontinued and switched to p.o.  During  this time the patient had multiple pulmonary issues.  Due to his history  of tobacco use, the patient required oxygen postoperatively.  Unfortunately, the patient was able to be weaned off oxygen.  He was at  one point placed on 6 L to maintain saturations greater than 90%.  By  October 9, the patient's O2 was able to be decrease 4.5 L, maintaining  saturations at  90%.  Currently still attempting to wean O2 to at least 2  L prior to discharge.  Will plan to arrange home O2 prior to discharge.  The remaining patient's hospital course was unremarkable.  He remained  in normal sinus rhythm.  The patient remained hemodynamically stable.  Blood pressure and heart rate remained stable.  His incisions were  clean, dry and intact and healing well.  The patient was out of bed,  ambulating well without difficulty.  He was tolerating diet , no nausea  or vomiting noted.   The patient is tentatively ready for discharge home over the next 48  hours pending his nasal cannula was able to be decreased to 2 L and he  maintains O2 saturations greater than 90%.   FOLLOW-UP APPOINTMENTS:  A follow-up appointment has been arranged with  Dr. Dorris Fetch for May 21, 2007, at 12:30 p.m..  The patient will  need to obtain a PA and lateral chest x-ray 30 minutes prior to this  appointment.   ACTIVITY:  Patient instructed no driving until released to do so, no  heavy lifting over 10 pounds.  He is told to ambulate 3-4 times per day,  progress as tolerated and continue his breathing exercises.   INCISIONAL CARE:  The patient is told he can shower, washing his  incisions using soap and water.  He is to contact the office if he  develops any drainage or opening from any of his incision sites.   DIET:  The patient educated on a diet to be low-fat, low-salt.   DISCHARGE MEDICATIONS:  1. Percocet 5/325 mg 1-2 tablets q.4-6h. p.r.n. pain.  2. Guaifenesin 1200 mg b.i.d.  3. Albuterol two puffs b.i.d..  4. Avelox 400 mg daily.  5. Nicotine patch 21 mg/24 to be changed daily.  6. Aspirin 81 mg daily.  7. Lisinopril 40 mg daily.  8. Nitroglycerin 0.4 mg sublingually as needed.  9. Simvastatin 80 mg at night.  10.Amlodipine 10 mg at night.  11.Coreg 25 mg b.i.d.  12.Nystatin 1000 mg at night.  13.Albuterol inhaler 2 puffs b.i.d.      Theda Belfast,  PA      Salvatore Decent. Dorris Fetch, M.D.  Electronically Signed   KMD/MEDQ  D:  05/10/2007  T:  05/11/2007  Job:  [id number]

## 2010-12-14 NOTE — Assessment & Plan Note (Signed)
OFFICE VISIT   SHAFTER, JUPIN  DOB:  01/16/1933                                        July 03, 2007  CHART #:  [id number]   The patient is a 77 year old gentleman with a history of heavy tobacco  abuse and COPD.  He was found to have a left lower lobe nodule.  Did a  wedge resection, which showed a 1.2 cm adenocarcinoma invading the  visceral pleura.  We also did node sampling at the time, and he had  metastatic adenocarcinoma in the level 7 node making him stage IIIA.  He  was treated with a wedge resection and radioactive seed placement at the  time of surgery.  He was subsequently seen by Dr. Arbutus Ped and has been  started on chemotherapy.  He says he has received 1 treatment and has 2  more this month, and then I think 1 final treatment in January.  The  patient states that he has been doing well.  He is not smoking.  His  breathing has been stable.  He does still have some referred pain in the  anterior ribcage on the left side, but that is not debilitating for him.  He states that his weight has been stable.  He also states he no longer  is using his home oxygen.   PHYSICAL EXAMINATION:  The patient is a 77 year old gentleman in no  acute distress.  Blood pressure is 120/73, pulse 85, respirations 18,  oxygen saturation 95% on room air.  His lungs have distant, but  otherwise clear breath sounds.  There is no wheezing.  Cardiac exam has  regular rate and rhythm.  Normal S1, normal S2.  No rubs or murmurs.  He  has no supraclavicular, cervical, axillary, or epitrochlear adenopathy.   Chest x-ray shows postoperative change and improved aeration in the left  lower lobe.   IMPRESSION:  The patient is doing well at this point in time.  He is now  about 2 months out from his surgery.  He has started chemotherapy and he  has tolerated the first course of that well.  Hopefully, he will  continue to tolerate that well.  He does have stage IIIA disease,  so has  significant risk for recurrence.  I will plan to see him back in 3  months.  Will do a chest CT at  that time.  He will continue to be followed by Drs. Mohamed and Soddy-Daisy  in the interim.  Will try to streamline his imaging to prevent him from  having redundant studies.   Salvatore Decent Dorris Fetch, M.D.  Electronically Signed   SCH/MEDQ  D:  07/03/2007  T:  07/03/2007  Job:  [id number]   cc:   Lianne Bushy, M.D.  Artist Pais Kathrynn Running, M.D.  Lajuana Matte, MD

## 2010-12-14 NOTE — Assessment & Plan Note (Signed)
OFFICE VISIT   Derek Jordan, Derek Jordan  DOB:  July 14, 1933                                        March 07, 2007  CHART #:  [id number]   Derek Jordan is a 77 year old gentleman with a left lower lobe nodule, who  was seen in the office last week regarding that nodule.  He has a heavy  smoking history and COPD.  At that time, we did an office spirometry,  which showed a markedly reduced FEV1.  Therefore, he was sent for formal  pulmonary function testing.  The patient states that he is still smoking  but has cut down significantly.   Formal pulmonary function testing revealed an FEV1 of 1.78, which was  77% of predicted.  His FEV1 to FVC ratio was 68.  His FEF25-75 was 0.97,  which was 43% of predicted, and his MVV was only 34% of predicted.  His  DLCO was 51% of predicted.   I had a discussion with Mr. and Mrs. Jordan about these pulmonary  function results.  He does have an FEV1 that would seem to indicate he  would tolerate a lobectomy, however, with his MVV and DLCO numbers, he  would be at a significant risk for increased pulmonary complications in  the postoperative period.  Given his ongoing tobacco abuse, I suspect  that he might be a good candidate for a wedge resection with radioactive  seed implant given the relatively small nature of this lesion and its  peripheral location.  Obviously that would all be dependent on him  actually having a lung cancer when this is removed with an excisional  biopsy and examined by frozen section.  We did discuss the option of  doing the lobectomy, and I do have concerns about his ability to  tolerate that operation from a general medical standpoint as well as  from a pulmonary standpoint, although his spirometry last week was worse  than expected, and these spirometry results are far better than I  anticipated with the exception of the diffusion capacity and MVV.  Therefore, I think this patient is a much better candidate  for a limited  resection rather than a lobectomy if this were to be a cancer.   After discussion of this option with the patient and his wife, they are  interested in learning more about a peripheral resection with seed  implantation.  They do understand that this is not standard of care but  is a promising new line of treatment, but that it cannot be guaranteed  that his outcomes will be similar with the two procedures.  In fact, he  probably is at somewhat increased risk of recurrence with the wedge plus  seeds than he would be with a lobectomy.  However, I do think there  would be less morbidity up front with that approach.  We are going to  schedule Derek Jordan to see Dr. Kathrynn Running in the office to discuss further  the possibility of radioactive seed implantation at the time of a wedge  resection.  After he has had a chance to talk with Dr. Kathrynn Running, I will  communicate with the patient and we can set up his surgery.  He also had  a thyroid nodule that lit up on PET scan that is going to be fine needle  biopsied next Tuesday  so we should know the results of that prior to  proceeding with his thoracic operation.   Derek Jordan Dorris Fetch, M.D.  Electronically Signed   SCH/MEDQ  D:  03/07/2007  T:  03/08/2007  Job:  [id number]   cc:   Bevelyn Buckles. Bensimhon, MD  Lianne Bushy, M.D.  Artist Pais Kathrynn Running, M.D.

## 2010-12-14 NOTE — Assessment & Plan Note (Signed)
OFFICE VISIT   Derek Jordan, Derek Jordan  DOB:  1932-12-21                                        April 27, 2007  CHART #:  [id number]   Derek Jordan is a 77 year old gentleman with a left lower lobe nodule.  He  has a history of heavy smoking and COPD.  His formal pulmonary function  testing showed borderline maximum voluntary ventilation and diffusion  capacity.  We discussed potential surgical options including lobectomy  for which I think he would be a marginal candidate versus a wedge  resection with or without radioactive seed implantation.  I had referred  Derek Jordan to Dr. Kathrynn Running, and they did discuss potential radioactive  seed implantation.  Derek Jordan says that he still is not 100% sure he  wants to go through with surgery, but I had a long discussion with him  regarding the natural history of lung cancer.  We have not definitively  proven that this is, but given the CT and PET findings, it is highly  unlikely it is anything but a primary lung cancer, but the plan would be  to do a left VATS wedge resection with pathologic examination by frozen  section followed by a radioactive seed implant to avoid the additional  morbidity of a lobectomy with his marginal pulmonary function.  He does  understand that this is still a major operation and requires general  anesthesia, there will be significant postoperative discomfort, he  understands the expected postoperative stay and overall recovery, he  understands that the risks include but are not limited to death, stroke,  MI, DVT, PE, bleeding, the possible need for transfusions, infections,  and potential pulmonary complications including pneumonia and prolonged  ventilator dependence.  He also understands that this is a compromised  operation with as yet unproven benefits, but  promising early results.  At this point, Derek Jordan says he will proceed  with surgery and it is scheduled on Tuesday, September the  30th.   Salvatore Decent Dorris Fetch, M.D.  Electronically Signed   SCH/MEDQ  D:  04/27/2007  T:  04/27/2007  Job:  [id number]   cc:   Molli Hazard A. Kathrynn Running, M.D.  Lianne Bushy, M.D.  Bevelyn Buckles. Bensimhon, MD  Lajuana Matte, MD

## 2010-12-14 NOTE — Assessment & Plan Note (Signed)
OFFICE VISIT   Derek Jordan, Derek Jordan  DOB:  1933-07-25                                        January 25, 2008  CHART #:  [id number]   The patient is a 77 year old gentleman status post wedge resection with  radioactive seed placement for a left lower lobe adenocarcinoma.  Level  7 nodes were positive at the time of surgery, and therefore he was a  stage III A.  He underwent chemotherapy as well as mediastinal radiation  and tolerated both well.  He now returns for a 64-month followup visit.  He is 9 months out from surgery at this point in time.  He says he is  doing very well, he has got no complaints.  He has  not had any recent  problems with breathing.  Weight has been stable.  He does not have any  sequela related to radiation or chemo, does not have any pain related to  his previous surgery.   On medications, there have been no interval changes since his last  visit.   He has no known drug allergies.   PHYSICAL EXAMINATION:  GENERAL:  The patient is a 77 year old white male  in no acute distress.  VITAL SIGNS:  Blood pressure is 90/57, pulse 95, respirations were 18,  and his oxygen saturation 92% on room air.  LUNGS:  Diminished breath sounds bilaterally.  There is no wheezing.  Wounds are well-healed.  LYMPHATICS:  There is no cervical, supraclavicular, axillary, or  epitrochlear adenopathy.   CT scan is reviewed.  There is surgical scarring.  There is decrease in  size of his mediastinal nodes.  There is no evidence of recurrent  disease.  He does have an abdominal aortic aneurysm, which measures 3.4  x 3.0 centimeters in diameter.   IMPRESSION:  The patient is doing well at this point.  He is now 9  months out from surgery and is treated with chemo and radiation as well.  There is no evidence of recurrent disease.  I will  plan on seeing him back in 3 months, that would be his 1-year followup,  we will do a PET CT at that time.   Salvatore Decent  Dorris Fetch, M.D.  Electronically Signed   SCH/MEDQ  D:  01/25/2008  T:  01/25/2008  Job:  [id number]   cc:   Lianne Bushy, M.D.  Artist Pais Kathrynn Running, M.D.  Lajuana Matte, MD

## 2010-12-14 NOTE — Progress Notes (Signed)
HPI:  Derek Jordan is a delightful 77 year old male with a history of single-vessel coronary artery disease, status post acute inferior wall myocardial infarction treated with Cypher drug-eluting stent to the mid right coronary artery in 2005.  His LV function was normal.  There was mild noncritical disease elsewhere.  He also has a history of lung CA (s/p resection/chemo/XRT), hyperlipidemia, severe peripheral vascular disease with a small infrarenal abdominal aneurysm, carotid stenosis and greater than 60% left renal artery stenosis by ultrasound.  Returns for f/u. Denies CP or SOB feels he is doing well. 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 No TIA symptoms. Still smoking 1/2 ppd of unfiltered cigarettes per day. Denies claudication. BP has been running low and feels dizzy at times. No focal neuro symptoms.   Last carotid 8/10: R 40-59% L 0-39%         ab [place]: AAA 3.3 cm x 3.3 cm    ROS: All systems negative except as listed in HPI, PMH and Problem List.  Past Medical History  Diagnosis Date  . Myocardial infarct   . Mediastinal lymphadenopathy   . Hx of radiation therapy   . Thyroid nodule   . Adenocarcinoma   . Carotid artery stenosis   . COPD (chronic obstructive pulmonary disease)   . HTN (hypertension)   . PVD (peripheral vascular disease)   . Hyperlipidemia   . Coronary atherosclerosis of native coronary artery   . Sinusitis     Current Outpatient Prescriptions  Medication Sig Dispense Refill  . amLODipine (NORVASC) 10 MG tablet Take 10 mg by mouth daily.        [name] aspirin 81 MG tablet Take 81 mg by mouth daily.        [name] atorvastatin (LIPITOR) 40 MG tablet Take 40 mg by mouth daily.        . carvedilol (COREG) 25 MG tablet Take 25 mg by mouth 2 (two) times daily with a meal.        . lisinopril (PRINIVIL,ZESTRIL) 40 MG tablet Take 40 mg by mouth daily.        . niacin (NIASPAN) 1000 MG CR tablet Take 1,000 mg by mouth at bedtime.        . nitroGLYCERIN (NITROSTAT) 0.4 MG SL tablet Place  0.4 mg under the tongue every 5 (five) minutes as needed.           PHYSICAL EXAM: Filed Vitals:   12/14/10 0959  BP: 98/46   General: no resp difficulty. smells like smoke HEENT: normal Neck: supple. no JVD. Carotids 2+ bilat; no bruits. No lymphadenopathy or thryomegaly appreciated. Cor: PMI nondisplaced. Regular rate & rhythm. No rubs, gallops, murmur. Lungs: clear with significantly decreased air movement throughout Abdomen: soft, nontender, nondistended. No hepatosplenomegaly. No bruits or masses. Good bowel sounds. Extremities: no cyanosis, clubbing, rash, edema Neuro: alert & orientedx3, cranial nerves grossly intact. moves all 4 extremities w/o difficulty. affect pleasant  ECG: SR 72 + PVC. No ST-T wave abnormalities.     ASSESSMENT & PLAN:

## 2010-12-14 NOTE — Assessment & Plan Note (Signed)
Counseled on need to stop smoking.  

## 2010-12-14 NOTE — Assessment & Plan Note (Signed)
Due for repeat u/s. Will schedule. 

## 2010-12-14 NOTE — Letter (Signed)
March 07, 2007   Molli Hazard A. Kathrynn Running, M.D.  [address]  [place], [place] [address]   Re:  Derek Jordan, Derek Jordan                DOB:  Feb 19, 1933   Dear [name]:   Thank you for agreeing to see [name] in the office. Derek Jordan is  a 77 year old gentleman who was recently having a CT scan to evaluate an  abdominal aortic aneurysm when they noted a small peripheral left lower  lobe nodule. This was about 1.2 cm in diameter in the left lower lobe of  his lung. A PET scan showed increased uptake in this area, as well as a  thyroid nodule which is being worked up separately.   Derek Jordan has a long history of tobacco abuse and has rather marginal  pulmonary function testing to tolerate a lobectomy. I think that he  might be better served with a limited resection. I did discuss with him  the option of doing a limited resection with radioactive seed  implantation. He and his wife were interested in learning more about the  potential benefits of the radioactive seed implantation. They do  understand that this is a relatively new treatment that has not yet been  proven equal to lobectomy in terms of long term recurrence, but would  likely have much less physiologic impact on him in the postoperative.   Again, thank you very much for agreeing to see Derek Jordan and look  forward to talking to you once you have had a chance to review the  patient and his records.   Salvatore Decent Dorris Fetch, M.D.  Electronically Signed   SCH/MEDQ  D:  03/07/2007  T:  03/08/2007  Job:  [id number]

## 2010-12-14 NOTE — Assessment & Plan Note (Signed)
OFFICE VISIT   ANCEL, EASLER  DOB:  12/20/1932                                        May 21, 2007  CHART #:  [id number]   REASON FOR VISIT:  Mr. Kwiatek is a 77 year old gentleman who had a wedge  resection and placement of radioactive seeds on May 01, 2007, for  what was thought to be a stage IA adenocarcinoma.  Lymph node biopsies  were done at the time of surgery and his level 7 nodes were positive  making him a stage IIIA.  He had difficulty with hypoxia and persistent  oxygen requirement postoperatively and was ultimately discharged with  home oxygen.  He states that since he has been home, he is making  progress.  He is anxious to get out of the house and he is still using 1  L nasal cannula oxygen.  He had been sent home on 2 L.  He did walk to  his truck without the oxygen today without any problems.  He has not had  any problems with wheezing or acute shortness of breath since discharge.   PHYSICAL EXAMINATION:  GENERAL:  Mr. Belcher is a 77 year old gentleman  in no acute distress.  VITAL SIGNS:  Blood pressure 98/61, pulse 88, respirations 18, oxygen  saturations 95% on room air.  LUNGS:  Diminished breath sounds bilaterally, slightly less on the left  base than elsewhere.  His incisions are clean, dry and intact and  healing well.   His chest x-ray shows postoperative changes, but there has been  improvement in the aeration at the bases and decrease in his small  pleural effusions.   IMPRESSION/PLAN:  Mr. Kleven is a 77 year old gentleman.  He is status  post wedge resection and seed placement for what originally appeared to  be a IA lesion, unfortunately, he is IIIA given the positive subcarinal  node.  He has an appointment to see Dr. Arbutus Ped on October 28, to  discuss possible chemotherapy.  He will also follow up with Dr. Kathrynn Running.  I do not know if additional radiation to his mediastinum would be  feasible in the setting of  the seeds, but that should be discussed as  well.  We will help him set up an appointment to see Dr. Kathrynn Running.  I  will plan to see him back in about 6 weeks.  We will check a plain view  chest x-ray at that time to check on his progress.  He is only having to  take about one pain pill a day, but says that he has plenty of those  presently, so I did not give him a prescription for any additional pain  medication at this time.   Salvatore Decent Dorris Fetch, M.D.  Electronically Signed   SCH/MEDQ  D:  05/21/2007  T:  05/22/2007  Job:  [id number]   cc:   Lianne Bushy, M.D.  Bevelyn Buckles. Bensimhon, MD  Artist Pais. Kathrynn Running, M.D.  Lajuana Matte, MD

## 2010-12-14 NOTE — Assessment & Plan Note (Signed)
[facility]                            CARDIOLOGY OFFICE NOTE   NAME:Derek Jordan, Derek Jordan                       MRN:          [id number]  DATE:10/08/2007                            DOB:          April 13, 1933    PRIMARY CARE PHYSICIAN:  Dr. Kathrynn Humble.   INTERVAL HISTORY:  [name] is a very pleasant 77 year old male with a  history of single-vessel coronary artery disease status post acute  inferior wall myocardial infarction treated with Cypher drug-eluting  stent to the mid right coronary artery 2005.  LV function was normal.  There was mild noncritical disease elsewhere.  He also has a history  hypertension, hyperlipidemia, peripheral vascular disease with small  infrarenal abdominal aortic aneurysm, carotid stenosis and evidence of  renal artery stenosis.  Finally, he has a history of COPD and was  diagnosed with lung cancer last year.  He is status post resection of  his left partial resection of his left lung.  He has been treated with  chemotherapy.  He is starting radiation this week.   He returns today for team follow-up.  He says he is doing fine.  He is  getting his strength back from his chemotherapy and surgery.  He denies  any chest pain or shortness of breath.  He has not had problems with  claudication or ulceration.  There has been no heart failure.   CURRENT MEDICATIONS:  1. Carvedilol 25 b.i.d.  2. Lisinopril 40 a day.  3. Aspirin 325 a day.  4. Simvastatin 80 a day.  5. Niaspan 1000 mg a day.  6. Amlodipine 10 mg a day.  7. Albuterol inhaler.   PHYSICAL EXAMINATION:  He is in no acute distress.  He ambulates around  the clinic without any respiratory difficulty.  He is alopecic.  Blood pressure is 104/62, heart rate 74, weight 135.  HEENT:  He is edentulous.  Otherwise normal.  NECK:  Is supple.  No JVD.  Carotids are 2+ bilaterally without any  audible bruits.  There is no lymphadenopathy or thyromegaly.  CARDIAC:  Regular  rate and rhythm.  No murmurs, rubs or gallops.  LUNGS:  Clear with decreased air movement throughout.  No wheezes or  rales.  ABDOMEN:  Is soft, nontender, nondistended.  No hepatosplenomegaly, no  bruits, no masses.  Good bowel sounds.  EXTREMITIES:  Warm with no cyanosis, clubbing or edema.  His right DP  pulse is 1+.  His left is nonpalpable.  There is no ulceration or rash.  NEUROLOGICAL:  Alert and x3.  Cranial nerves II-XII intact.  Moves all  four extremities without difficulty.  Affect is pleasant.   EKG shows sinus rhythm at a rate of 74.  There are small U waves.  No  significant ST-T wave abnormalities.   ASSESSMENT/PLAN:  1. Coronary artery disease.  This is stable.  He is doing well.  No      evidence of ischemia.  Continue current therapy.  2. Hypertension, well-controlled.  3. Hyperlipidemia.  Goal LDL is less than 70.  He said this has been  followed by his primary care doctor.  Continue his current lipid      therapy.  4. Peripheral arterial disease.  He had ultrasound of his abdomen      today which showed a stable small AAA at 3.3 x 3.3 cm.  This will      need yearly follow-up.  He does have what appears to be greater      than 60% left renal artery stenosis and probable left external      iliac occlusion.  However, he is relatively asymptomatic for this.      There is no evidence of limb compromise or renal dysfunction.  We      will continue conservative therapy.  5. Carotid stenosis.  He has a 60-79% stenosis on the right and 0-39%      on the left.  He is due for repeat ultrasound later this year.  We      will follow this as well.   DISPOSITION:  I will see him back in 6 months for routine follow-up and  continue current therapy.     Bevelyn Buckles. Bensimhon, MD  Electronically Signed    DRB/MedQ  DD: 10/08/2007  DT: 10/09/2007  Job #: [id number]   cc:   Lianne Bushy, M.D.

## 2010-12-14 NOTE — Assessment & Plan Note (Addendum)
BP running low. Will cut amlodipine to 5mg  daily. Can stop completely if BP still running low.

## 2010-12-14 NOTE — Consult Note (Signed)
NEW PATIENT CONSULTATION   Derek Jordan, Derek Jordan  DOB:  September 08, 1932                                        February 27, 2007  CHART #:  [id number]   CHIEF COMPLAINT:  Derek Jordan is a 77 year old gentleman who was sent for  consultation regarding a left lower lobe nodule.   HISTORY OF PRESENT ILLNESS:  Derek Jordan is a 77 year old gentleman with  a past medical history significant for coronary artery disease including  acute MI and stent placement, hypertension, hyperlipidemia, peripheral  vascular disease, and heavy tobacco abuse as well as COPD.  He is  followed by Dr. Gala Jordan and has been since his MI.  He had a CT scan  done in June for evaluation of an abdominal aortic aneurysm.  This  showed a 1.4 cm spiculated lesion in the left lower lobe posteriorly  adjacent to the spine suggestive of bronchogenic carcinoma.  There also  was a smaller lesion about 6-7 mm in size in the right middle lobe.  There was some prominent subcarinal lymph nodes with a diameter of 1.2  cm.  There also was a right thyroid nodule.  The patient states that he  had been doing well from a pulmonary standpoint.  He does have  occasional wheezing but nothing out of the ordinary.  He has a chronic  cough, but he has not noted any hemoptysis.  He has not had any new  headaches or visual changes.  He has not had any new or different bone  or joint pain.  He does continue to smoke he says a pack a day.  His  wife indicates that this is likely more than that.  A PET scan was done  which showed increased uptake in the thyroid nodule as well as the left  lower lobe nodule.  There was not increased uptake in the right middle  lobe nodule nor in the mediastinal lymph nodes.  The patient also of  note denies any chest tightness or shortness of breath with exertion.   PAST MEDICAL HISTORY:  1. Hypertension.  2. Hyperlipidemia.  3. Coronary artery disease, status post MI, status post stent       placement.  4. Peripheral vascular disease.  5. Infrarenal abdominal aortic aneurysm.  6. COPD.   He denies DVT, stroke, or diabetes.   CURRENT MEDICATIONS:  1. Aspirin 325 mg daily.  2. Coreg 25 mg daily.  3. Simvastatin 80 mg daily.  4. Lisinopril 40 mg daily.  5. Niaspan 1000 mg daily.  6. Amlodipine 10 mg daily.   He has no known drug allergies.   FAMILY HISTORY:  Mother is still alive.  His father died at age 5 from  a heart attack.   SOCIAL HISTORY:  The patient lives with his wife.  He still works on a  farm including physical labor.  He is currently smoking 1 pack a day but  has smoked as much as 3-4 packs per day and has a 60-year history of  smoking.   REVIEW OF SYSTEMS:  Please see patient medical history form which was  reviewed and is on the chart.  All positive symptoms were noted in the  HPI.  All other systems are negative.   PHYSICAL EXAMINATION:  GENERAL:  Derek Jordan is a thin, 77 year old,  white male in no  acute distress.  He is, however, well nourished.  VITAL SIGNS:  Blood pressure 119/68, pulse 76, respirations 18, oxygen  saturation 91% on room air.  NEUROLOGIC:  He is alert and oriented x3.  He is appropriate and grossly  intact.  HEENT:  Unremarkable except for glasses.  NECK:  Supple without thyromegaly, adenopathies, or bruits.  I did not  feel discrete thyroid masses.  LUNGS:  Distant and almost inaudible breath sounds bilaterally.  There  is no acute wheezing.  There is no cervical, supraclavicular, axillary,  or epitrochlear adenopathies.  CARDIAC:  Regular rate and rhythm.  Normal S1 and S2.  No rubs, murmurs,  or gallops.  ABDOMEN:  Soft and nontender.  EXTREMITIES:  Without cyanosis or edema.  There is a suggestion of  possible early mild clubbing.  His extremities have brisk capillary  refill.   His CT scan and PET scan were reviewed.   IMPRESSION:  Derek Jordan is a 77 year old gentleman with a history of  heavy tobacco abuse and  chronic obstructive pulmonary disease who was  now found to have a new solitary pulmonary nodule.  There was concern  for some mediastinal lymphadenopathy, but his PET scan did not show any  increased uptake in these areas.  There also was a right middle lobe  nodule which also did not demonstrate increased uptake but is rather  small.  There is also a thyroid nodule which is being worked up  separately.   This lung nodule almost certainly represents a primary bronchogenic  carcinoma given the patient's age and smoking history.  I discussed in  detail with him the likelihood that this was a lung cancer as well as  the general outline for treatment of lung cancer.  We did pulmonary  function testing in the office to establish whether or not he would be a  candidate for resection.  His forced expiratory volume in 1 second was  only 0.59 which is 29% of normal.  Forced expiratory flow 25-75 was only  16% of normal both of which are very concerning for his ability to  tolerate even a very limited pulmonary resection.  I have recommended to  Derek Jordan that we proceed with formal pulmonary function testing  including diffusion capacities testing with and without bronchodilators  and room air arterial blood gas to assess his candidacy for a surgical  procedure.  Hopefully, formal testing will reveal slightly better  pulmonary function at least adequate to tolerate any limited resection.  I will plan to see Derek Jordan back in 1 week to discuss the results.   Salvatore Decent Dorris Fetch, M.D.  Electronically Signed   SCH/MEDQ  D:  02/27/2007  T:  02/28/2007  Job:  [id number]   cc:   Lajuana Matte, MD  Bevelyn Buckles. Bensimhon, MD  Lianne Bushy, M.D.

## 2010-12-14 NOTE — Assessment & Plan Note (Signed)
OFFICE VISIT   FERRY, MATTHIS  DOB:  July 03, 1933                                        September 24, 2007  CHART #:  [id number]   Derek Jordan is a 77 year old gentleman with a history of tobacco abuse  and COPD.  He had a wedge resection for a 1.2 cm adenocarcinoma and it  was node positive and the level 7 nodes therefore being a 3A.  We also  placed radioactive seeds at the time of surgery. He was seen  postoperatively by Dr. Arbutus Ped and treated with chemotherapy.  He has  had four cycles of carboplatin and Taxol.  He said that his hair was  falling out so he went ahead and shaved it off.  He has also noted over  the past few weeks as the weather has been colder that he has a little  more pain in his incision but other than that he feels like he has been  doing fairly well.  He returns today for routine follow-up and restaging  CT scan.  He has seen Dr. Kathrynn Running and is scheduled in the near future to  undergo mediastinal irradiation.   The patient's medical history form is reviewed and is on the chart.   MEDICATIONS:  1. Coreg 25 mg daily.  2. Simvastatin 80 mg daily.  3. Lisinopril 40 mg daily.  4. Niaspan 1000 mg daily.  5. Amlodipine 10  mg daily.  6. Proventil inhaler p.r.n.  7. Percocet.  8. Albuterol.  9. Nitrostat p.r.n. as well.   ALLERGIES:  He has no known drug allergies.   PHYSICAL EXAMINATION:  GENERAL:  Derek Jordan is a 77 year old gentleman.  He has alopecia.  VITALS:  His blood pressure is 108/76, pulse of 90, respirations 16.  Oxygen saturation is 95% on room air.  LUNGS:  Have diminished breath sounds bilaterally but they are equal.  CARDIAC EXAM:  Regular rate and rhythm.  Normal S1/S2. There is no  supraclavicular, cervical, axillary or epitrochlear adenopathy.   LABORATORY DATA:  CT scan shows some enlargement of a precarinal node  that is actually just to the right of the carina and would be a 4R node.  This is 12.4 x 16.2  mm.   IMPRESSION:  Derek Jordan is a 77 year old gentleman.  He is now about 5  months out from left upper lobe wedge resection and seed placement who  has been treated with adjuvant chemotherapy for stage 3A disease.  His  CT today shows slight increase in size of a precarinal 4R node although  is does not appear to be greatly enlarged. It is a concern given that he  did have positive level 7 nodes at the time of his operation.  The node  that is presently in question was not within the reach of surgery.  He  is planning to have mediastinal radiation.  I think if there is some  micrometastases there that should take care of it.  It may well be that  this node is just inflamed due to his previous surgery and radioactive  seeds.  I do not think there is any further intervention necessary at  this time.  He is going to see Dr. Kathrynn Running on the 28th. I will plan to  see him back in about 4 months.  Will do  a repeat CT scan at that time.   Derek Jordan, M.D.  Electronically Signed   SCH/MEDQ  D:  09/24/2007  T:  09/24/2007  Job:  [id number]

## 2010-12-14 NOTE — Assessment & Plan Note (Addendum)
Goal LDL < 70. Will check lipids and liver panel today. Continue atorvastatin.

## 2010-12-14 NOTE — Patient Instructions (Signed)
Decrease Amlodipine to 5mg  daily (1/2 tab) Labs today (bmet, liver, lipid) Your physician has requested that you have a carotid duplex. This test is an ultrasound of the carotid arteries in your neck. It looks at blood flow through these arteries that supply the brain with blood. Allow one hour for this exam. There are no restrictions or special instructions. Your physician has requested that you have an abdominal aorta duplex. During this test, an ultrasound is used to evaluate the aorta. Allow 30 minutes for this exam. Do not eat after midnight the day before and avoid carbonated beverages Your physician wants you to follow-up in: 1 year.  You will receive a reminder letter in the mail two months in advance. If you don't receive a letter, please call our office to schedule the follow-up appointment.

## 2010-12-17 ENCOUNTER — Telehealth: Payer: Self-pay | Admitting: Internal Medicine

## 2010-12-17 MED ORDER — CARVEDILOL 25 MG PO TABS: 25.0000 mg | ORAL_TABLET | Freq: Two times a day (BID) | ORAL | Status: DC

## 2010-12-17 MED ORDER — NIACIN ER (ANTIHYPERLIPIDEMIC) 1000 MG PO TBCR: 1000.0000 mg | EXTENDED_RELEASE_TABLET | Freq: Every day | ORAL | Status: DC

## 2010-12-17 NOTE — Telephone Encounter (Signed)
pharmacy has faxed several times no one has answer any of the faxes. Pharmacist states pt needs niacin and carvedilol.

## 2010-12-17 NOTE — H&P (Signed)
NAMEJAMI, OHLIN                ACCOUNT NO.:  [id number]   MEDICAL RECORD NO.:  [id number]          PATIENT TYPE:  INP   LOCATION:  1824                         FACILITY:  [facility]   PHYSICIAN:  Jonelle Sidle, M.D. LHCDATE OF BIRTH:  1933/05/30   DATE OF ADMISSION:  06/08/2004  DATE OF DISCHARGE:                                HISTORY & PHYSICAL   CHIEF COMPLAINT:  Recent chest discomfort and an episode of weakness.   HISTORY OF PRESENT ILLNESS:  Mr. Dibella is a pleasant 77 year old male with  a significant tobacco use history as well as history of coronary artery  disease in his father at age 68 who is referred for evaluation by Dr. Purnell Shoemaker.  Mr. Staebell states that he awoke yesterday morning at 3:30 complaining of a  cramping in his right hand followed by similar symptoms in his left hand,  then left arm, and ultimately his chest.  These symptoms were moderate in  intensity and lasted approximately 10 minutes.  He got up and went to the  bathroom and ultimately his symptoms subsided and he was able to go back to  sleep.  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  While he was driving, he had an episode of  visual blurriness and headache and later on in the day, an episode of  profound weakness and possibly presyncope or syncope, although this is not  well documented.  He was apparently seen by EMS at that time and reportedly  had a low blood pressure.  He refused transport for medical care at that  time and was reportedly told to drink fluids and see his doctor the  following day.  He saw Dr. Purnell Shoemaker today and had electrocardiogram performed.  Given concerns about this and his history, Dr. Corinda Gubler was contacted and the  patient is now in the emergency department here at [facility] for  further assessment.   On interview, Mr. Angelillo now is comfortable without any complaints.  His  electrocardiogram shows sinus rhythm  with inferolateral ST-T wave changes,  specifically, ST segment depression in the inferior leads with T-wave  inversions also noted.  There is no old tracing for comparison.  Initial  point of care troponin I level is also elevated at 4.5 with a CK MB of 31.3.  He denies any history of coronary artery disease or myocardial infarction.   ALLERGIES:  No known drug allergies.   MEDICATIONS:  None chronically.   PAST MEDICAL HISTORY:  1.  Possibly hypertension based on presenting blood pressure, although this      has not been documented or treated chronically based on the patient's      description.  2.  Unknown lipid status.  3.  Status post tonsillectomy at age 70.  4.  No reported history of stroke, bleeding disorder, type 2 diabetes      mellitus, or proven heart disease.   SOCIAL HISTORY:  The patient lives in [place] with his wife.  His daughter  is present today, as  well.  He has a 1 to 3 pack per day history of smoking  for approximately 40 years.  He denies any ongoing alcohol use.   FAMILY HISTORY:  Significant for the death of the patient's father at age 51  with an apparent myocardial infarction.   REVIEW OF SYMPTOMS:  As described in the history of present illness.  He  wears glasses for reading.  He does not have typical exertional chest pain  or shortness of breath.  He denies any active wheezing.  Otherwise, systems  negative.   PHYSICAL EXAMINATION:  VITAL SIGNS:  Temperature 97.2, heart rate 84, respirations 20, blood  pressure 165/90, oxygen saturation 98% on room air.  GENERAL:  This is a thin male lying supine in no acute distress.  HEENT:  Conjunctivae is normal, oropharynx clear.  NECK:  Supple without loud bruits or elevated jugular venous pressure.  No  thyromegaly is noted.  LUNGS:  Diminished breath sounds with no active wheezing or rhonchi.  HEART:  Regular rate and rhythm with somewhat distant heart sounds, but no  S3 gallop.  ABDOMEN:  Soft, normal  active bowel sounds.  EXTREMITIES:  No pitting edema.  Peripheral pulses are 2+.  SKIN:  No ulcer changes noted.  MUSCULOSKELETAL:  No kyphosis noted.   LABORATORY DATA:  Chest x-ray is pending.  WBC 9.4, hemoglobin 13.9,  platelets 183.  Sodium 141, potassium 3.5, chloride 108, glucose 82, BUN 9,  creatinine 1.2.   IMPRESSION:  1.  Acute coronary syndrome with initial point of care troponin I level of      23.23 in a 77 year old male with long-standing tobacco use history and      family history of coronary artery disease.  He is presently symptom      free.  He may have had an episode of syncope or presyncope also      associated with his presentation, although this is not entirely clear.      His electrocardiogram is abnormal as outlined.  2.  Unknown lipid status.  3.  Possible hypertension.   PLAN:  1.  Will admit the patient to telemetry and continue to cycle cardiac      markers.  2.  Treat with aspirin, Lovenox, and low dose beta blocker, can add      nitroglycerin if he has any further symptoms.  3.  Follow up on chest x-ray.  4.  Check fasting lipid profile.  5.  Anticipate diagnostic coronary angiography to clearly outline the      coronary anatomy.  I discussed this with the patient and his daughter.      They both agreed to proceed.       SGM/MEDQ  D:  06/08/2004  T:  06/08/2004  Job:  [id number]

## 2010-12-17 NOTE — Cardiovascular Report (Signed)
NAMEMJ, WILLIS                ACCOUNT NO.:  [id number]   MEDICAL RECORD NO.:  [id number]          PATIENT TYPE:  INP   LOCATION:  6527                         FACILITY:  [facility]   PHYSICIAN:  Salvadore Farber, M.D. LHCDATE OF BIRTH:  [birth year]   DATE OF PROCEDURE:  06/09/2004  DATE OF DISCHARGE:                              CARDIAC CATHETERIZATION   PROCEDURE:  Drug-eluting stent placement in the mid right coronary artery.   INDICATION:  Mr. Cousineau is a 77 year old gentleman without prior history of  cardiovascular disease, who presents with non-ST-elevation MI manifest as  resting chest discomfort for approximately 5 minutes on Monday.  He  underwent diagnostic angiography by Dr. Rollene Rotunda today, demonstrating  99% stenosis in the mid RCA with heavy thrombus burden.  I was asked to  perform percutaneous revascularization.   PROCEDURAL TECHNIQUE:  Informed consent was obtained.  Under 1% lidocaine,  double-bolus eptifibatide and weight-based heparin were administered to  achieve an ACT of greater than 200 seconds.  TRITON study drug was  administered prior to the intervention.  A 6-French JR4 guide was advanced  via the pre-existing right common femoral arterial sheath.  It was  selectively engaged in the RCA.  A Prowater wire was advanced beyond the  lesion without difficulty.  The lesion was predilated using a 2.5 x 15.0-mm  Maverick at 12 atmospheres.  The lesion was then stented using a 3.5 x 28.0-  mm CYPHER at 18 atmospheres.  The entirety of the stent was then post-  dilated using a 4.0 x 18.0-mm PowerSail for 2 successive inflations at 16  atmospheres each.  Final angiography demonstrated no residual stenosis, no  distal embolization, no dissection and TIMI-3 flow to the distal  vasculature.  The patient tolerated the procedure well and was transferred  to the holding room in stable condition.   COMPLICATIONS:  None.   IMPRESSION/RECOMMENDATION:  Successful  drug-eluting stent placement in the  mid right coronary artery.  The patient is enrolled in the TRITON study.  He  should not be given Plavix; instead, he will remain on the TRITON study drug  for the duration of the study.  He should be maintained on aspirin  indefinitely.  High-dose statin will be initiated.  Smoking cessation was  strongly advised.       WED/MEDQ  D:  06/09/2004  T:  06/10/2004  Job:  [id number]   cc:   Lianne Bushy, M.D.  [address]  [place]  [place] [address]  Fax: [phone]   Rollene Rotunda, M.D.

## 2010-12-17 NOTE — Cardiovascular Report (Signed)
NAMEHELMUTH, [name]   MEDICAL RECORD NO.:  [id number]          PATIENT TYPE:  INP   LOCATION:  6529                         FACILITY:  [facility]   PHYSICIAN:  Rollene Rotunda, M.D.   DATE OF BIRTH:  1933-06-08   DATE OF PROCEDURE:  06/09/2004  DATE OF DISCHARGE:                              CARDIAC CATHETERIZATION   PRIMARY CARE PHYSICIAN:  Lianne Bushy, M.D.   PROCEDURE:  Left heart catheterization/coronary arteriography.   INDICATIONS FOR PROCEDURE:  To evaluate patient with unstable angina.   DESCRIPTION OF PROCEDURE:  Left heart catheterization was performed via the  right femoral artery.  The aorta was cannulated using anterior wall  puncture.  A #6 French arterial sheath was inserted via the modified  Seldinger technique.  Pre-formed Judkins and a pigtail catheter were  utilized.  The patient tolerated the procedure well.  Left the lab in stable  condition.   RESULTS:  1.  Hemodynamics:  LV 157/32, _________160/81.  2.  Coronaries:  The left main was normal.  The left anterior descending had      a long proximal 40% stenosis.  The first and second diagonal were small      and normal.  Circumflex had proximal calcification.  There was a mid      obtuse marginal which was large and branching which had proximal 40%      stenosis.  The right coronary artery was a dominant vessel.  There was      mid 99% stenosis with thrombus.  There was a long mid 25% stenosis.      There was small posterior lateral and posterior descending artery.  3.  Left ventriculogram:  The left ventriculogram was obtained in the RAO      projection.  The ejection fraction was 60% with mild inferior      hypokinesis.   CONCLUSION:  Severe single vessel coronary artery disease.   PLAN:  The patient will have percutaneous revascularization of the right  coronary artery per Dr. Samule Ohm.       JH/MEDQ  D:  06/09/2004  T:  06/09/2004  Job:  [id number]   cc:   Lianne Bushy, M.D.  [address]  [place]  [place] [address]  Fax: [phone]

## 2010-12-17 NOTE — Assessment & Plan Note (Signed)
[facility]                            CARDIOLOGY OFFICE NOTE   NAME:Derek Jordan, Derek Jordan                         MRN:          [id number]  DATE:07/21/2006                            DOB:          [birth year]    PATIENT IDENTIFICATION:  Derek Jordan is a very pleasant 77 year old male  who returns for routine followup here.   PROBLEM LIST:  1. Single vessel coronary artery disease. Status post acute inferior      wall myocardial infarction with Cypher drug-eluting stent to the      mid-right coronary artery in 2005.  LV function was normal and      noncritical disease elsewhere.  2. Hypertension.  3. Hyperlipidemia with low HDL.  4. Peripheral vascular disease, asymptomatic.      a.     ABIs done December 2005, show 0.93 on the right and 0.73 on       the left.      b.     Small infrarenal abdominal aortic aneurysm by ultrasound       February 2007 measuring 2.9 x 2.9.  5. Ongoing tobacco use with chronic obstructive pulmonary disease.   CURRENT MEDICATIONS:  1. Aspirin 325 mg.  2. Caduet 40/5 mg.  3. Lisinopril 40 mg.  4. Niaspan 500 mg.  5. Coreg 25 mg b.i.d.  6. Plavix 75 mg.   INTERVAL HISTORY:  Derek Jordan returns today for routine followup.  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  He  has not had any dyspnea, no heart failure, he denies any claudication.  Unfortunately continues to smoke about a pack of cigarettes a day.  He  is having trouble affording his medications with a bill of over $700.00  a month.   PHYSICAL EXAMINATION:  He is an elderly male, in no acute distress,  ambulates around the clinic without any respiratory difficulty.  Blood  pressure is 132/82 with a heart rate of 136.  HEENT:  Sclerae anicteric, EOMI, there are no xanthelasmas, mucus  membranes are moist, poor dentition.  NECK:  Supple, no JVD, carotids 2+ bilaterally without bruits, there is  no lymphadenopathy, or thyromegaly.  CARDIAC:  Regular rate and rhythm, no murmurs, rubs, or gallops.  LUNGS:  Diminished air movement throughout with a prolonged expiratory  phase, no wheezes or rales.  ABDOMEN:  Soft, nontender, nondistended, there is no hepatosplenomegaly,  no bruits, no masses, there are good bowel sounds.  EXTREMITIES:  Warm with no cyanosis, clubbing, or edema, distal pulses  are 1+ bilaterally.  NEUROLOGIC:  He is alert and oriented x3, cranial nerves II-XII are  intact, he moves all 4 extremities without difficulty.  He has a bright  affect.  EKG shows normal sinus rhythm at a rate of 74 with no ST-T wave changes.   ASSESSMENT/PLAN:  1. Coronary artery disease, this is stable without any evidence of      recurrent angina.  He is interested in stopping his Plavix given      that he is 2 years out  from his infarct.  I told him I thought that      was quite reasonable.  I did warn him about the small risk of late      stent thrombosis and told him should he experience any significant      chest pain to call 911 immediately.  2. Hyperlipidemia, given his problem with medication costs, we will      switch him over from Lipitor 40 to Zocor 80, check a lipid panel.  3. Hypertension, he is nearing goal.  We will increase his Norvasc to      10.  4. Peripheral vascular disease, this is asymptomatic.  I did remind      him of a need to quit smoking.  He will need follow up on his      triple-A at the next visit.  5. Tobacco use, I once again warned him the risks of continued smoking      and asked him to quit.   DISPOSITION:  Return to clinic in 6 months.     Derek Jordan. Bensimhon, MD  Electronically Signed    DRB/MedQ  DD: 07/21/2006  DT: 07/21/2006  Job #: [id number]

## 2010-12-17 NOTE — Discharge Summary (Signed)
NAMEHAMID, Derek Jordan NO.:  [id number]   MEDICAL RECORD NO.:  [id number]          PATIENT TYPE:  INP   LOCATION:  6529                         FACILITY:  [facility]   PHYSICIAN:  Jonelle Sidle, M.D. LHCDATE OF BIRTH:  1932/12/19   DATE OF ADMISSION:  06/08/2004  DATE OF DISCHARGE:  06/11/2004                           DISCHARGE SUMMARY - REFERRING   SUMMARY OF HISTORY:  Derek Jordan is a 77 year old white male who drove from  his primary care physician's office for cardiac evaluation on June 08, 2004, at [facility] emergency room.  Derek Jordan stated that yesterday  morning, he was awakened at approximately 3:30 with left hand and right hand  cramping sensation radiating up his left arm into his anterior chest.  This  lasted for approximately 20 minutes not associated with shortness of breath,  nausea, vomiting, or diaphoresis.  He went to the bathroom and there was not  any change with the discomfort while walking.  He does not know any  alleviating or aggravating factors and he cannot rate the discomfort.  He  initially felt fine when he woke up later that morning, however, around 11  a.m. while driving, he developed some blurry vision and felt his vision was  not focused.  He also noted a headache.  He denied any other neurological  symptoms.  This lasted for about 30 seconds but the headache persisted but  eventually resolved.  He had a second episode with the same visual problems,  but he felt bad.  He denies actual loss of consciousness and his friend  called EMS.  EMS told him his blood pressure was low and he was probably  dehydrated.  The patient refused trans portion or further evaluation and  they recommended him seeing his primary care physician.  In the interval of  his visual disturbances, he states he drank five gallons of water and he  feels much better and has not had any further episodes.   ALLERGIES:  No known drug allergies.   CURRENT  MEDICATIONS:  No prescriptions or over the counter medications.   PAST MEDICAL HISTORY:  He has a possible history of hypertension that has  not been treated.   PAST SURGICAL HISTORY:  Notable for tonsillectomy at age 85 and he denies any  history of diabetes, CVA, myocardial infarction, COPD, bleeding dyscrasias,  thyroid dysfunction.  He does not know his lipid status.   SOCIAL HISTORY:  Notable for tobacco use.   LABORATORY DATA:  Blood was transported with the patient from Dr. Harlene Salts  office, however, this had clotted, thus more blood was drawn.  Fasting  lipids on November 10 showed a total cholesterol 123, triglycerides 68, HDL  31, LDL 78.  CK total MB and troponins were positive.  Peak CK level at the  time of admission was 416 and 29.8 and troponin peak was 13.54 the day after  admission.  Hemoglobin A1C was within normal limits at 5.2.  Admission  sodium was 141, potassium 3.5, BUN 9, creatinine 1.2.  Subsequent  chemistries were essentially unremarkable.  It was  noted that his AST was  slightly elevated at 66.  The initial PTT was 31, PT 11.7.  H&H 13.9 and  39.9, normal indices, platelets 183, WBC 6.4.  EKG showed normal sinus  rhythm with right axis deviation, slightly delayed R wave, nonspecific ST-T  wave changes.  Subsequent EKGs were essentially the same.   HOSPITAL COURSE:  Derek Jordan was admitted to [facility] for further  evaluation.  Overnight enzymes were positive for non-Q wave myocardial  infarction.  Thus, he underwent cardiac catheterization.  Catheterization  performed by Dr. Antoine Poche revealed a mid 99% thrombosed RCA lesion.  He  underwent Cypher stenting to this lesion without difficulty.  His ejection  fraction was 60% with mild inferior hypokinesis.  Stenting was performed by  Dr. Samule Ohm and he was entered into the [place] study.  Overnight, he did  well, however, he did develop a low grade fever and Dr. Jens Som felt that  he should remain in  the hospital for observation for another 24 hours given  the circumstances.  By June 11, 2004, he was doing much better.  We did  not have an official report on the patient's chest x-ray from the primary  care physician's office, this was repeated.  The preliminary report was  unremarkable, thus the patient was discharged home.   DISCHARGE DIAGNOSIS:  1.  Non-Q wave myocardial infarction status post drug eluting stenting of an      right coronary artery.  2.  Tobacco use.  3.  Chronic obstructive pulmonary disease.   DISPOSITION:  He is discharged home on the T[place]on study drug.  He is not  to receive any prescriptions for Plavix.   DISCHARGE MEDICATIONS:  Coated aspirin 325 mg, Toprol XL 25 mg daily,  Lipitor 40 mg q.8h.  Dr. Jens Som feels that his Toprol and Lipitor should  be titrated up.   DISCHARGE INSTRUCTIONS:  He was advised to discontinue smoking.  He will  follow up with Dr. Gala Romney on November 28 at 9:45.  He was instructed no  lifting, driving, sexual activity, or heavy exertion until seen by the  physician and to continue a low salt, low fat cholesterol diet, if he had  any cath site problems, he was advised to call.  He also received a  prescription for sublingual nitroglycerin.       EW/MEDQ  D:  06/11/2004  T:  06/11/2004  Job:  [id number]   cc:   Lianne Bushy, M.D.  [address]  [place]  [place] [address]  Fax: [phone]   Arvilla Meres, M.D. [facility]

## 2010-12-23 ENCOUNTER — Encounter: Payer: Self-pay | Admitting: *Deleted

## 2010-12-30 ENCOUNTER — Encounter (INDEPENDENT_AMBULATORY_CARE_PROVIDER_SITE_OTHER): Payer: Medicare Other | Admitting: Cardiology

## 2010-12-30 DIAGNOSIS — I700 Atherosclerosis of aorta: Secondary | ICD-10-CM

## 2010-12-30 DIAGNOSIS — I714 Abdominal aortic aneurysm, without rupture: Secondary | ICD-10-CM

## 2010-12-30 DIAGNOSIS — I6529 Occlusion and stenosis of unspecified carotid artery: Secondary | ICD-10-CM

## 2010-12-30 DIAGNOSIS — Z8679 Personal history of other diseases of the circulatory system: Secondary | ICD-10-CM

## 2010-12-31 ENCOUNTER — Encounter: Payer: Self-pay | Admitting: Internal Medicine

## 2011-01-19 ENCOUNTER — Other Ambulatory Visit: Payer: Self-pay | Admitting: *Deleted

## 2011-01-19 DIAGNOSIS — E041 Nontoxic single thyroid nodule: Secondary | ICD-10-CM

## 2011-01-26 ENCOUNTER — Ambulatory Visit (HOSPITAL_COMMUNITY)
Admission: RE | Admit: 2011-01-26 | Discharge: 2011-01-26 | Disposition: A | Discharge status: Home / Self Care | Payer: Medicare Other | Source: Ambulatory Visit | Attending: Internal Medicine | Admitting: Internal Medicine

## 2011-01-26 DIAGNOSIS — E041 Nontoxic single thyroid nodule: Secondary | ICD-10-CM

## 2011-01-26 DIAGNOSIS — E049 Nontoxic goiter, unspecified: Secondary | ICD-10-CM | POA: Insufficient documentation

## 2011-01-27 ENCOUNTER — Telehealth: Payer: Self-pay | Admitting: *Deleted

## 2011-01-27 NOTE — Telephone Encounter (Signed)
Called pt to review the results of thyroid u/s.  Called home number listed and got a recording stating the mail box has not been set up yet.  RESULTS OF U/S ARE STABLE IN SIZE AND APPEARANCE WHEN COMPARED TO PREVIOUS STUDY

## 2011-02-07 NOTE — Telephone Encounter (Signed)
Test results

## 2011-02-07 NOTE — Telephone Encounter (Signed)
Pt aware of thryoid u/s results

## 2011-02-07 NOTE — Telephone Encounter (Signed)
Have called a couple of times unable to leave a mess, will continue trying

## 2011-02-21 ENCOUNTER — Other Ambulatory Visit: Payer: Self-pay | Admitting: Internal Medicine

## 2011-03-22 ENCOUNTER — Other Ambulatory Visit: Payer: Self-pay | Admitting: Internal Medicine

## 2011-05-12 LAB — DIFFERENTIAL
Eosinophils Absolute: 0.3
Eosinophils Relative: 4
Lymphocytes Relative: 28
Lymphs Abs: 2
Monocytes Absolute: 1 — ABNORMAL HIGH

## 2011-05-12 LAB — BASIC METABOLIC PANEL
Calcium: 8.6
Chloride: 102
Chloride: 108
Creatinine, Ser: 1.3
GFR calc Af Amer: >60
GFR calc Af Amer: >60
GFR calc non Af Amer: 54 — ABNORMAL LOW
Potassium: 3.9
Sodium: 141

## 2011-05-12 LAB — BLOOD GAS, ARTERIAL
Acid-Base Excess: 0.8
Bicarbonate: 25.5 — ABNORMAL HIGH
FIO2: 0.21
O2 Content: 4
O2 Saturation: 92.2
O2 Saturation: 91.5
TCO2: 28.2
pCO2 arterial: 49.6 — ABNORMAL HIGH
pCO2 arterial: 40.8
pH, Arterial: 7.351
pO2, Arterial: 62.5 — ABNORMAL LOW
pO2, Arterial: 58.9 — ABNORMAL LOW

## 2011-05-12 LAB — CBC
HCT: 32.5 — ABNORMAL LOW
HCT: 37 — ABNORMAL LOW
HCT: 41.2
Hemoglobin: 11.3 — ABNORMAL LOW
Hemoglobin: 11.4 — ABNORMAL LOW
MCV: 94.4
MCV: 95.5
MCV: 95.9
MCV: 94
Platelets: 136 — ABNORMAL LOW
Platelets: 192
Platelets: 204
RBC: 3.46 — ABNORMAL LOW
RBC: 3.74 — ABNORMAL LOW
RBC: 3.87 — ABNORMAL LOW
RDW: 14.1 — ABNORMAL HIGH
RDW: 15 — ABNORMAL HIGH
WBC: 10.6 — ABNORMAL HIGH
WBC: 11.1 — ABNORMAL HIGH
WBC: 9.3
WBC: 8.6

## 2011-05-12 LAB — COMPREHENSIVE METABOLIC PANEL
AST: 30
Albumin: 3.7
BUN: 21
Calcium: 9.2
Creatinine, Ser: 1.38
GFR calc Af Amer: >60
Total Protein: 6.6

## 2011-05-12 LAB — EXPECTORATED SPUTUM ASSESSMENT W GRAM STAIN, RFLX TO RESP C

## 2011-05-12 LAB — CULTURE, RESPIRATORY W GRAM STAIN: Culture: NORMAL

## 2011-05-12 LAB — PROTIME-INR: Prothrombin Time: 11.9

## 2011-05-12 LAB — ABO/RH: ABO/RH(D): A POS

## 2011-05-12 LAB — URINALYSIS, ROUTINE W REFLEX MICROSCOPIC
Glucose, UA: NEGATIVE
Hgb urine dipstick: NEGATIVE
Ketones, ur: NEGATIVE
Protein, ur: NEGATIVE

## 2011-05-16 LAB — BLOOD GAS, ARTERIAL
FIO2: 0.21
O2 Saturation: 95.4
Patient temperature: 98.6
TCO2: 26.5
pH, Arterial: 7.389

## 2012-03-06 ENCOUNTER — Other Ambulatory Visit (HOSPITAL_COMMUNITY): Payer: Self-pay | Admitting: *Deleted

## 2012-03-06 MED ORDER — ATORVASTATIN CALCIUM 40 MG PO TABS: 40.0000 mg | ORAL_TABLET | Freq: Every day | ORAL | Status: DC

## 2012-04-06 ENCOUNTER — Other Ambulatory Visit (HOSPITAL_COMMUNITY): Payer: Self-pay | Admitting: *Deleted

## 2012-04-06 MED ORDER — LISINOPRIL 40 MG PO TABS: 40.0000 mg | ORAL_TABLET | Freq: Every day | ORAL | Status: DC

## 2012-09-12 ENCOUNTER — Other Ambulatory Visit (HOSPITAL_COMMUNITY): Payer: Self-pay | Admitting: *Deleted

## 2012-09-12 MED ORDER — NIACIN ER (ANTIHYPERLIPIDEMIC) 1000 MG PO TBCR: 1000.0000 mg | EXTENDED_RELEASE_TABLET | Freq: Every day | ORAL | Status: DC

## 2013-04-09 ENCOUNTER — Other Ambulatory Visit (HOSPITAL_COMMUNITY): Payer: Self-pay | Admitting: *Deleted

## 2013-04-09 MED ORDER — LISINOPRIL 40 MG PO TABS: 40.0000 mg | ORAL_TABLET | Freq: Every day | ORAL | Status: DC

## 2013-06-05 ENCOUNTER — Other Ambulatory Visit (HOSPITAL_COMMUNITY): Payer: Self-pay | Admitting: Internal Medicine

## 2013-07-12 ENCOUNTER — Other Ambulatory Visit (HOSPITAL_COMMUNITY): Payer: Self-pay | Admitting: Internal Medicine

## 2013-07-16 ENCOUNTER — Other Ambulatory Visit (HOSPITAL_COMMUNITY): Payer: Self-pay | Admitting: Internal Medicine

## 2014-12-22 ENCOUNTER — Ambulatory Visit (INDEPENDENT_AMBULATORY_CARE_PROVIDER_SITE_OTHER): Payer: Medicare Other | Admitting: Interventional Cardiology

## 2014-12-22 ENCOUNTER — Other Ambulatory Visit: Payer: Self-pay

## 2014-12-22 ENCOUNTER — Encounter: Payer: Self-pay | Admitting: Interventional Cardiology

## 2014-12-22 VITALS — BP 166/90 | HR 77 | Ht 64.0 in | Wt 105.0 lb

## 2014-12-22 DIAGNOSIS — I714 Abdominal aortic aneurysm, without rupture, unspecified: Secondary | ICD-10-CM | POA: Insufficient documentation

## 2014-12-22 DIAGNOSIS — E782 Mixed hyperlipidemia: Secondary | ICD-10-CM | POA: Diagnosis not present

## 2014-12-22 DIAGNOSIS — Z87891 Personal history of nicotine dependence: Secondary | ICD-10-CM

## 2014-12-22 DIAGNOSIS — I2510 Atherosclerotic heart disease of native coronary artery without angina pectoris: Secondary | ICD-10-CM

## 2014-12-22 DIAGNOSIS — Z7901 Long term (current) use of anticoagulants: Secondary | ICD-10-CM | POA: Diagnosis not present

## 2014-12-22 DIAGNOSIS — I10 Essential (primary) hypertension: Secondary | ICD-10-CM | POA: Diagnosis not present

## 2014-12-22 DIAGNOSIS — Z8679 Personal history of other diseases of the circulatory system: Secondary | ICD-10-CM | POA: Diagnosis not present

## 2014-12-22 LAB — CBC WITH DIFFERENTIAL/PLATELET
Basophils Absolute: 0 10*3/uL (ref 0.0–0.1)
Basophils Relative: 0.2 % (ref 0.0–3.0)
Eosinophils Absolute: 0.3 10*3/uL (ref 0.0–0.7)
Eosinophils Relative: 3.2 % (ref 0.0–5.0)
HCT: 40.7 % (ref 39.0–52.0)
Hemoglobin: 14 g/dL (ref 13.0–17.0)
Lymphocytes Relative: 27.6 % (ref 12.0–46.0)
Lymphs Abs: 2.2 10*3/uL (ref 0.7–4.0)
MCHC: 34.4 g/dL (ref 30.0–36.0)
MCV: 95.6 fl (ref 78.0–100.0)
MONOS PCT: 8.6 % (ref 3.0–12.0)
Monocytes Absolute: 0.7 10*3/uL (ref 0.1–1.0)
NEUTROS ABS: 4.7 10*3/uL (ref 1.4–7.7)
NEUTROS PCT: 60.4 % (ref 43.0–77.0)
PLATELETS: 214 10*3/uL (ref 150.0–400.0)
RBC: 4.26 Mil/uL (ref 4.22–5.81)
RDW: 15.8 % — AB (ref 11.5–15.5)
WBC: 7.9 10*3/uL (ref 4.0–10.5)

## 2014-12-22 LAB — LIPID PANEL
CHOL/HDL RATIO: 2
CHOLESTEROL: 111 mg/dL (ref 0–200)
HDL: 48.4 mg/dL (ref >39.00)
LDL Cholesterol: 54 mg/dL (ref 0–99)
NonHDL: 62.6
Triglycerides: 41 mg/dL (ref 0.0–149.0)
VLDL: 8.2 mg/dL (ref 0.0–40.0)

## 2014-12-22 LAB — COMPREHENSIVE METABOLIC PANEL
ALK PHOS: 63 U/L (ref 39–117)
ALT: 14 U/L (ref 0–53)
AST: 27 U/L (ref 0–37)
Albumin: 3.7 g/dL (ref 3.5–5.2)
BILIRUBIN TOTAL: 0.5 mg/dL (ref 0.2–1.2)
BUN: 16 mg/dL (ref 6–23)
CO2: 32 meq/L (ref 19–32)
Calcium: 9.4 mg/dL (ref 8.4–10.5)
Chloride: 97 mEq/L (ref 96–112)
Creatinine, Ser: 0.96 mg/dL (ref 0.40–1.50)
GFR: 80.1 mL/min (ref >60.00)
GLUCOSE: 82 mg/dL (ref 70–99)
Potassium: 4.7 mEq/L (ref 3.5–5.1)
Sodium: 132 mEq/L — ABNORMAL LOW (ref 135–145)
TOTAL PROTEIN: 7.1 g/dL (ref 6.0–8.3)

## 2014-12-22 MED ORDER — ATORVASTATIN CALCIUM 40 MG PO TABS: 40.0000 mg | ORAL_TABLET | Freq: Every day | ORAL | Status: DC

## 2014-12-22 NOTE — Progress Notes (Signed)
Patient ID: Derek Jordan, male   DOB: [birth year], 81 y.o.   MRN: [id number]     Cardiology Office Note   Date:  12/22/2014   ID:  Derek Jordan, Derek Jordan Dec 27, 1932, MRN [id number]  PCP:  No primary care provider on file. Dr. York Ram: phone [phone], fax [phone]   No chief complaint on file.    Wt Readings from Last 3 Encounters:  12/22/14 105 lb (47.628 kg)  12/14/10 123 lb (55.792 kg)  12/10/09 128 lb (58.06 kg)       History of Present Illness: Derek Jordan is a 81 y.o. male  with a history of single-vessel coronary artery disease, status post acute inferior wall myocardial infarction treated with Cypher drug-eluting stent to the mid right coronary artery in 2005, 3.5 x 28 mm. His LV function was normal. There was mild noncritical disease elsewhere. He also has a history of lung CA (s/p resection/chemo/XRT), hyperlipidemia, severe peripheral vascular disease with a small infrarenal abdominal aneurysm, carotid stenosis and greater than 60% left renal artery stenosis by ultrasound.  His sx was syncope with the MI.  No chest pain, pressure or tightness.  His only sx now is DOE.  HE attributes that to his lung disease and surgery.  He still smokes.  He smokes 1-1.5 packs per day.  His BP at home is not checked.  He had low BP at the eye doctor. He has a known AAA which has not been checked recently. He ran out of his atorvastatin.       Past Medical History  Diagnosis Date  . Myocardial infarct   . Mediastinal lymphadenopathy   . Hx of radiation therapy   . Thyroid nodule   . Adenocarcinoma   . Carotid artery stenosis   . COPD (chronic obstructive pulmonary disease)   . HTN (hypertension)   . PVD (peripheral vascular disease)   . Hyperlipidemia   . Coronary atherosclerosis of native coronary artery   . Sinusitis     Past Surgical History  Procedure Laterality Date  . Biopsy thyroid  03-13-2007  . Thoracoscopic surgical,wedge resection (05-01-2007)      . Tonsillectomy    . Coronary artery disease, s/p ptca (06-09-2004)       Current Outpatient Prescriptions  Medication Sig Dispense Refill  . aspirin 81 MG tablet Take 81 mg by mouth daily.      [name] atorvastatin (LIPITOR) 40 MG tablet Take 1 tablet (40 mg total) by mouth daily. 30 tablet 3  . carvedilol (COREG) 25 MG tablet Take 1 tablet (25 mg total) by mouth 2 (two) times daily with a meal. (Patient taking differently: Take 25 mg by mouth daily. ) 60 tablet 6  . cetirizine (ZYRTEC) 10 MG tablet Take 10 mg by mouth daily.      . ILEVRO 0.3 % ophthalmic suspension Place 2 drops into the right eye daily.   4  . lisinopril (PRINIVIL,ZESTRIL) 40 MG tablet TAKE 1 TABLET BY MOUTH EVERY DAY 30 tablet 0  . niacin (NIASPAN) 1000 MG CR tablet Take 1 tablet (1,000 mg total) by mouth at bedtime. 30 tablet 6  . nitroGLYCERIN (NITROSTAT) 0.4 MG SL tablet Place 0.4 mg under the tongue every 5 (five) minutes as needed.       No current facility-administered medications for this visit.    Allergies:   Review of patient's allergies indicates no known allergies.    Social History:  The patient  reports that he  has been smoking Cigarettes.  He does not have any smokeless tobacco history on file.   Family History:  The patient's *family history includes Diabetes in his mother; Heart attack in an other family member; Heart failure in his father; Hypertension in his father.    ROS:  Please see the history of present illness.   Otherwise, review of systems are positive for DOE.   All other systems are reviewed and negative.    PHYSICAL EXAM: VS:  BP 166/90 mmHg  Pulse 77  Ht _0  (1.626 m)  Wt 105 lb (47.628 kg)  BMI 18.01 kg/m2 , BMI Body mass index is 18.01 kg/(m^2). GEN: THin, frail, in no acute distress HEENT: normal Neck: no JVD, carotid bruits, or masses Cardiac: *RRR; no murmurs, rubs, or gallops,no edema , 2+ radial pulses bilaterally Respiratory:  clear to auscultation bilaterally, normal  work of breathing GI: soft, nontender, nondistended, + BS MS: no deformity or atrophy Skin: warm and dry, no rash Neuro:  Strength and sensation are intact Psych: euthymic mood, full affect   EKG:   The ekg ordered today demonstrates NSR, PVC, NSST   Recent Labs: No results found for requested labs within last 365 days.   Lipid Panel    Component Value Date/Time   CHOL 88 12/14/2010 1033   TRIG 47.0 12/14/2010 1033   HDL 37.10* 12/14/2010 1033   CHOLHDL 2 12/14/2010 1033   VLDL 9.4 12/14/2010 1033   LDLCALC 42 12/14/2010 1033     Other studies Reviewed: Additional studies/ records that were reviewed today with results demonstrating: Cath report : 3.5 x 28 stent to RCA by Dr. Albertine Patricia in 2005..   ASSESSMENT AND PLAN:  1. CAD/Inferior MI: s/p RCA stent as above.  Continue aspirin.  He has been off of Plavix for several years. We stressed the importance of aggressive secondary prevention including a healthy diet, avoiding tobacco and lipid-lowering therapy. Refill atorvastatin. Will check lipids today since he is fasting. We'll check electrolytes, liver function tests and CBC as well. 2. Tobacco abuse: I stressed the importance of stopping smoking. He has cut back but he cannot quit completely. He gets intense cravings after a day of missing a cigarette. He states that he has been smoking since age 64. 47. AAA: Small aneurysm noted in 2012. We'll plan for ultrasound to look for progression of the aneurysm. 4. I also spoke about the importance of drinking more water and decreasing his caffeine intake. He drinks large amounts of caffeinated coffee, soda and tea. 5. Dyspnea on exertion: Likely multifactorial. Difficult to see whether there is ischemia as the cause. He likely has emphysema given his duration of smoking. We'll have him first make the lifestyle changes of better hydrating himself, and we'll also see what his lab tests reveal.   Current medicines are reviewed at length with  the patient today.  The patient concerns regarding his medicines were addressed.  The following changes have been made:  No change  Labs/ tests ordered today include:   Orders Placed This Encounter  Procedures  . Lipid Profile  . Comp Met (CMET)  . CBC with Differential  . EKG 12-Lead    Recommend 150 minutes/week of aerobic exercise Low fat, low carb, high fiber diet recommended  Disposition:   FU in 1 month with APP   Teresita Madura., MD  12/22/2014 1:54 PM    [facility] [address], [place], [place]  [address] Phone: [phone];  Fax: [phone]

## 2014-12-22 NOTE — Patient Instructions (Addendum)
**Note De-Identified Makenzey Nanni Obfuscation** Medication Instructions:  Same  Labwork: Today (CMET, CBCD and Lipids  Testing/Procedures: AAA Duplex  Follow-Up: Your physician recommends that you schedule a follow-up appointment in: 1 month with a PA or NP to follow up blood pressure   Stop drinking coffee, Tea and Soda. Start drinking more water. Stop smoking.

## 2015-01-02 ENCOUNTER — Ambulatory Visit (HOSPITAL_COMMUNITY): Payer: Medicare Other | Attending: Internal Medicine

## 2015-01-02 DIAGNOSIS — I714 Abdominal aortic aneurysm, without rupture, unspecified: Secondary | ICD-10-CM

## 2015-01-02 DIAGNOSIS — I240 Acute coronary thrombosis not resulting in myocardial infarction: Secondary | ICD-10-CM | POA: Diagnosis not present

## 2015-01-02 DIAGNOSIS — I745 Embolism and thrombosis of iliac artery: Secondary | ICD-10-CM | POA: Insufficient documentation

## 2015-01-06 ENCOUNTER — Other Ambulatory Visit: Payer: Self-pay | Admitting: *Deleted

## 2015-01-06 DIAGNOSIS — I714 Abdominal aortic aneurysm, without rupture, unspecified: Secondary | ICD-10-CM

## 2015-01-18 NOTE — Progress Notes (Signed)
Cardiology Office Note   Date:  01/19/2015   ID:  Derek Jordan, Derek Jordan 05/13/1933, MRN [id number]  PCP:  No PCP Per Patient  Cardiologist:  Dr. Casandra Doffing     Chief Complaint  Patient presents with  . Follow-up    Hypertension, Dyspnea     History of Present Illness: Derek Jordan is a 81 y.o. male with a hx of CAD status post inferior Non-STEMI in 06/2004 treated with a Cypher DES to the mid RCA, lung CA status post resection/chemotherapy/radiation, severe PAD, AAA, carotid stenosis, renal artery stenosis (>60% L RAS in the past), HL, ongoing tobacco abuse.  Previously followed by Dr. Glori Bickers.  Now followed by Dr. Casandra Doffing.  Last seen 12/22/14.  FU abdominal US demonstrated increased size of AAA (3.9 x 4.4 cm).  FU recommended in 6 mos.  He complained of dyspnea.  His BP was also elevated.  Lifestyle modifications were recommended first.  He returns for FU on his BP.    Patient is here today with his daughter. He tells me that he started passing out in April. Review the records does not indicate that this was worked up at his last visit. He would only pass out around the time of using the bathroom. It would occur with standing or sitting. It typically occurs in the early morning hours. He has not had a further episode since one month ago. He has started hydrating himself better and drinking less coffee. He denies chest pain. He has chronic dyspnea with exertion. He is NYHA 2b. He has a chronic cough with sputum production. He continues to smoke. He denies orthopnea, PND or significant edema.  Systolic blood pressures at home have ranged anywhere from 156 up to 210.  However, he tells me that he had a follow-up after a recent eye surgery last month and was told that his systolic blood pressure was 64.    Studies/Reports Reviewed Today:  Abdominal US 01/02/15 Very irregular shaped abdominal aorta, with dilatation from the mid aorta through the iliac bifurcation.  Dimensions  have increased compared to previous, with the largest measurement at the level of the IMA, 3.9 cm x 4.4 cm.  There is intramural thrombus noted, and some calcification.  Normal caliber common and external iliac arteries, bilaterally. Chronic left external iliac artery occlusion. >> f/u 6 months  Carotid US 12/30/10 Bilateral ICA 0-39%  R Thyroid nodule >> need dedicated thyroid US FU 1 year  LHC 06/09/04 LM:  Ok LAD:  prox 40% LCx:  MOM 40% RCA:  Mid 99% with clot, mid 25% EF 60% PCI:  3.5 x 28.0-mm CYPHER DES to mid RCA   Past Medical History  Diagnosis Date  . Myocardial infarct   . Mediastinal lymphadenopathy   . Hx of radiation therapy   . Thyroid nodule   . Adenocarcinoma   . Carotid artery stenosis   . COPD (chronic obstructive pulmonary disease)   . HTN (hypertension)   . PVD (peripheral vascular disease)   . Hyperlipidemia   . Coronary atherosclerosis of native coronary artery   . Sinusitis     Past Surgical History  Procedure Laterality Date  . Biopsy thyroid  03-13-2007  . Thoracoscopic surgical,wedge resection (05-01-2007)    . Tonsillectomy    . Coronary artery disease, s/p ptca (06-09-2004)       Current Outpatient Prescriptions  Medication Sig Dispense Refill  . aspirin 81 MG tablet Take 81 mg by mouth daily.      [name]  atorvastatin (LIPITOR) 40 MG tablet Take 1 tablet (40 mg total) by mouth daily. 90 tablet 3  . cetirizine (ZYRTEC) 10 MG tablet Take 10 mg by mouth daily.      [name] lisinopril (PRINIVIL,ZESTRIL) 40 MG tablet Take 1 tablet (40 mg total) by mouth daily. 90 tablet 3  . carvedilol (COREG) 25 MG tablet Take 1/2 tablet (12.5 mg) by mouth twice daily 45 tablet 3   No current facility-administered medications for this visit.    Allergies:   Review of patient's allergies indicates no known allergies.    Social History:  The patient  reports that he has been smoking Cigarettes.  He does not have any smokeless tobacco history on file.   Family  History:  The patient's family history includes Diabetes in his mother; Heart attack in his father and another family member; Heart failure in his father; Hypertension in his father; Stroke in his sister.    ROS:   Please see the history of present illness.   Review of Systems  Cardiovascular: Positive for dyspnea on exertion.  All other systems reviewed and are negative.     PHYSICAL EXAM: VS:  BP 150/78 mmHg  Pulse 80  Ht '5\' 4"'$  (1.626 m)  Wt 109 lb 12.8 oz (49.805 kg)  BMI 18.84 kg/m2  SpO2 92%    Wt Readings from Last 3 Encounters:  01/19/15 109 lb 12.8 oz (49.805 kg)  12/22/14 105 lb (47.628 kg)  12/14/10 123 lb (55.792 kg)     GEN: thin frail elderly male, in no acute distress HEENT: normal Neck: no JVD,  no masses Cardiac:  Normal S1/S2, RRR; no murmur, no rubs or gallops, no edema   Respiratory:  Decreased breath sounds, no wheezing, rhonchi or rales. GI: soft, nontender, nondistended, + BS MS: no deformity or atrophy Skin: warm and dry  Neuro:  CNs II-XII intact, Strength and sensation are intact Psych: Normal affect   EKG:  EKG is not ordered today.  It demonstrates:   N/a    Recent Labs: 12/22/2014: ALT 14; BUN 16; Creatinine, Ser 0.96; Hemoglobin 14.0; Platelets 214.0; Potassium 4.7; Sodium 132*    Lipid Panel    Component Value Date/Time   CHOL 111 12/22/2014 1242   TRIG 41.0 12/22/2014 1242   HDL 48.40 12/22/2014 1242   CHOLHDL 2 12/22/2014 1242   VLDL 8.2 12/22/2014 1242   LDLCALC 54 12/22/2014 1242      ASSESSMENT AND PLAN:  Syncope, unspecified syncope type:  Etiology not clear. He had symptoms of syncope around the time of his myocardial infarction in 2005. However, these symptoms sound more like post micturition syncope. ECG done last month did not demonstrate evidence of AV block or prolonged QT. In any event, his syncope is unexplained. I have asked him to not drive for 6 months. I will arrange an echocardiogram, Lexiscan Myoview and 30 day  event monitor.  Essential hypertension:  Blood pressure uncontrolled. He is only taking carvedilol 25 mg once daily. Blood pressure is equal in both arms today by my check. I will change his carvedilol to 12.5 mg twice a day for better coverage. I have asked him to monitor his blood pressure until follow-up. He should Jordan his machine as well as a list of his blood pressures to his office visit. Consider amlodipine at follow-up.  Coronary artery disease involving native coronary artery of native heart without angina pectoris:  No chest pain. However, he had a history of syncope around the time  of his myocardial infarction. He has significant vascular disease and continues to smoke. Blood pressure is uncontrolled. He has not had an evaluation for ischemia since the time of his myocardial infarction. Given his baseline dyspnea, I do not believe that he could tolerate a treadmill test. Arrange Lexiscan Myoview.  AAA (abdominal aortic aneurysm):  Follow-up planned in December 2016.  Carotid stenosis, bilateral:  Mild plaque by ultrasound in 2012. Consider arranging follow-up carotid US at next visit.  Mixed hyperlipidemia:  Lipids controlled. Continue current therapy.  Left renal artery stenosis:  Creatinine normal by recent measurement. If blood pressure remains difficult to control, consider repeat renal arterial Doppler.  Tobacco abuse:  I have asked him to quit smoking. He is not ready.  Shortness of breath:  I suspect that he has significant COPD/emphysema. O2 on room air today is 92%. It does not sound as though he's had any follow-up for his prior history of lung CA. He does not follow with any primary care physician. I will set him up to see pulmonology for further recommendations, if any.  He at least needs surveillance for FU on his CA and might benefit from discussing O2 therapy with Pulmonology.      Medication Changes: Current medicines are reviewed at length with the patient today.   Concerns regarding medicines are as outlined above.  The following changes have been made:   Discontinued Medications   CARVEDILOL (COREG) 25 MG TABLET    Take 1 tablet (25 mg total) by mouth 2 (two) times daily with a meal.   ILEVRO 0.3 % OPHTHALMIC SUSPENSION    Place 2 drops into the right eye daily.    NIACIN (NIASPAN) 1000 MG CR TABLET    Take 1 tablet (1,000 mg total) by mouth at bedtime.   NITROGLYCERIN (NITROSTAT) 0.4 MG SL TABLET    Place 0.4 mg under the tongue every 5 (five) minutes as needed.     Modified Medications   Modified Medication Previous Medication   ATORVASTATIN (LIPITOR) 40 MG TABLET atorvastatin (LIPITOR) 40 MG tablet      Take 1 tablet (40 mg total) by mouth daily.    Take 1 tablet (40 mg total) by mouth daily.   CARVEDILOL (COREG) 25 MG TABLET carvedilol (COREG) 25 MG tablet      Take 1/2 tablet (12.5 mg) by mouth twice daily    Take 1/2 tablet (12.5 mg) by mouth twice daily   LISINOPRIL (PRINIVIL,ZESTRIL) 40 MG TABLET lisinopril (PRINIVIL,ZESTRIL) 40 MG tablet      Take 1 tablet (40 mg total) by mouth daily.    TAKE 1 TABLET BY MOUTH EVERY DAY   New Prescriptions   No medications on file     Labs/ tests ordered today include:   Orders Placed This Encounter  Procedures  . NM Myocar Multi W/Spect W/Wall Motion / EF  . Ambulatory referral to Pulmonology  . Cardiac event monitor  . Echocardiogram     Disposition:   FU with me in 6-8 weeks.    Signed, Versie Starks, MHS 01/19/2015 4:56 PM    [facility] [place], [place], [place]  [address] Phone: [phone]; Fax: [phone]

## 2015-01-19 ENCOUNTER — Encounter: Payer: Self-pay | Admitting: Physician Assistant

## 2015-01-19 ENCOUNTER — Ambulatory Visit (INDEPENDENT_AMBULATORY_CARE_PROVIDER_SITE_OTHER): Payer: Medicare Other | Admitting: Physician Assistant

## 2015-01-19 VITALS — BP 150/78 | HR 80 | Ht 64.0 in | Wt 109.8 lb

## 2015-01-19 DIAGNOSIS — I10 Essential (primary) hypertension: Secondary | ICD-10-CM | POA: Diagnosis not present

## 2015-01-19 DIAGNOSIS — R55 Syncope and collapse: Secondary | ICD-10-CM | POA: Diagnosis not present

## 2015-01-19 DIAGNOSIS — E782 Mixed hyperlipidemia: Secondary | ICD-10-CM

## 2015-01-19 DIAGNOSIS — I6523 Occlusion and stenosis of bilateral carotid arteries: Secondary | ICD-10-CM

## 2015-01-19 DIAGNOSIS — I6529 Occlusion and stenosis of unspecified carotid artery: Secondary | ICD-10-CM | POA: Insufficient documentation

## 2015-01-19 DIAGNOSIS — I2510 Atherosclerotic heart disease of native coronary artery without angina pectoris: Secondary | ICD-10-CM | POA: Diagnosis not present

## 2015-01-19 DIAGNOSIS — Z720 Tobacco use: Secondary | ICD-10-CM

## 2015-01-19 DIAGNOSIS — J449 Chronic obstructive pulmonary disease, unspecified: Secondary | ICD-10-CM

## 2015-01-19 DIAGNOSIS — I714 Abdominal aortic aneurysm, without rupture, unspecified: Secondary | ICD-10-CM

## 2015-01-19 DIAGNOSIS — I739 Peripheral vascular disease, unspecified: Secondary | ICD-10-CM

## 2015-01-19 DIAGNOSIS — I701 Atherosclerosis of renal artery: Secondary | ICD-10-CM

## 2015-01-19 MED ORDER — ATORVASTATIN CALCIUM 40 MG PO TABS: 40.0000 mg | ORAL_TABLET | Freq: Every day | ORAL | Status: AC

## 2015-01-19 MED ORDER — CARVEDILOL 25 MG PO TABS: ORAL_TABLET | ORAL | Status: DC

## 2015-01-19 MED ORDER — LISINOPRIL 40 MG PO TABS: 40.0000 mg | ORAL_TABLET | Freq: Every day | ORAL | Status: DC

## 2015-01-19 NOTE — Patient Instructions (Signed)
Medication Instructions: 1) Stop Niaspan 2) Take coreg (carvedilol) 25 mg 1/2 tablet (12.5 mg) twice daily  Labwork: - none  Procedures/Testing: 1) Your physician has requested that you have an echocardiogram. Echocardiography is a painless test that uses sound waves to create images of your heart. It provides your doctor with information about the size and shape of your heart and how well your heart's chambers and valves are working. This procedure takes approximately one hour. There are no restrictions for this procedure.  2) Your physician has requested that you have a lexiscan myoview. For further information please visit [email or web address]. Please follow instruction sheet, as given.  3) Your physician has recommended that you wear an event monitor. Event monitors are medical devices that record the heart's electrical activity. Doctors most often [place] these monitors to diagnose arrhythmias. Arrhythmias are problems with the speed or rhythm of the heartbeat. The monitor is a small, portable device. You can wear one while you do your normal daily activities. This is usually used to diagnose what is causing palpitations/syncope (passing out).  Follow-Up: 1) Your physician recommends that you schedule a follow-up appointment in: 6-8 weeks with [name], PA (on a day Dr. Irish Lack is in the office).  2) You have been referred to: Pulmonary- history of lung cancer/ COPD   Any Additional Special Instructions Will Be Listed Below (If Applicable). - Check your blood pressure once daily & keep a list of the readings - Bring the list of your readings & your BP machine to the office at your next follow up appointment

## 2015-01-22 ENCOUNTER — Encounter: Payer: Self-pay | Admitting: Internal Medicine

## 2015-01-22 ENCOUNTER — Ambulatory Visit (INDEPENDENT_AMBULATORY_CARE_PROVIDER_SITE_OTHER)
Admission: RE | Admit: 2015-01-22 | Discharge: 2015-01-22 | Disposition: A | Discharge status: Home / Self Care | Payer: Medicare Other | Source: Ambulatory Visit | Attending: Internal Medicine | Admitting: Internal Medicine

## 2015-01-22 ENCOUNTER — Ambulatory Visit (INDEPENDENT_AMBULATORY_CARE_PROVIDER_SITE_OTHER): Payer: Medicare Other | Admitting: Internal Medicine

## 2015-01-22 ENCOUNTER — Telehealth: Payer: Self-pay | Admitting: Internal Medicine

## 2015-01-22 VITALS — BP 134/80 | HR 85 | Ht 64.0 in | Wt 108.0 lb

## 2015-01-22 DIAGNOSIS — I10 Essential (primary) hypertension: Secondary | ICD-10-CM | POA: Diagnosis not present

## 2015-01-22 DIAGNOSIS — J449 Chronic obstructive pulmonary disease, unspecified: Secondary | ICD-10-CM

## 2015-01-22 DIAGNOSIS — Z720 Tobacco use: Secondary | ICD-10-CM | POA: Diagnosis not present

## 2015-01-22 DIAGNOSIS — R0600 Dyspnea, unspecified: Secondary | ICD-10-CM

## 2015-01-22 DIAGNOSIS — R0609 Other forms of dyspnea: Secondary | ICD-10-CM | POA: Insufficient documentation

## 2015-01-22 DIAGNOSIS — F17210 Nicotine dependence, cigarettes, uncomplicated: Secondary | ICD-10-CM

## 2015-01-22 DIAGNOSIS — R0902 Hypoxemia: Secondary | ICD-10-CM

## 2015-01-22 MED ORDER — PREDNISONE 10 MG PO TABS: ORAL_TABLET | ORAL | Status: DC

## 2015-01-22 MED ORDER — OLMESARTAN MEDOXOMIL 20 MG PO TABS: 20.0000 mg | ORAL_TABLET | Freq: Every day | ORAL | Status: DC

## 2015-01-22 NOTE — Telephone Encounter (Signed)
Called and spoke to pt's EC, Levada Dy. Informed Levada Dy the pred rx has been sent to preferred pharmacy. Levada Dy verbalized understanding and denied any further questions or concerns at this time.

## 2015-01-22 NOTE — Patient Instructions (Addendum)
Prednisone 10 mg take  4 each am x 2 days,   2 each am x 2 days,  1 each am x 2 days and stop   Stop lisinopril  Start benicar 20 one daily   The key is to stop smoking completely before smoking completely stops you!   Please remember to go to the   x-ray department downstairs for your tests - we will call you with the results when they are available.    Please schedule a follow up office visit in 6 weeks, call sooner if needed with pfts and repeat walking sats on return

## 2015-01-22 NOTE — Progress Notes (Signed)
Quick Note:  Spoke with pt and notified of results per Dr. Wert. Pt verbalized understanding and denied any questions.  ______ 

## 2015-01-22 NOTE — Progress Notes (Signed)
Subjective:    Patient ID: Derek Jordan, male    DOB: 1933-05-29,     MRN: [id number]  HPI  49 yowm active smoker widower lives in [place] with no primary care doctor referred by to pulmonary eval 01/22/2015 [name] for copd eval    01/22/2015 1st [facility] office visit/ [place] [facility]   Chief Complaint  Patient presents with  . Pulmonary Consult    Referred by [name], PA for eval of COPD. Pt c/o cough and SOB- progressively worse over the past year. He is SOB sometimes just sitting and with minimal exertion. Cough is prod with large amounts of sputum "sometimes clear sometimes dark".   s/p L lower lowb partial lobectomy  2008 : Adenoca  pT2, pN2, pMX  s/p RT and chemo and no doe until around 2015 on acei with indolent onset  Progressively worse  sob > slow and flat ok but  Sometimes can't even walk at a nl pace x 10 ft but varies a lot s pattern/ sometimes sob at rest/ no better with inhalers in past / assoc with severe coughing fits more day than night.  No obvious other patterns in day to day or daytime variabilty or assoc chronic cough or cp or chest tightness, subjective wheeze overt sinus or hb symptoms. No unusual exp hx or h/o childhood pna/ asthma or knowledge of premature birth.  Sleeping ok without nocturnal  or early am exacerbation  of respiratory  c/o's or need for noct saba. Also denies any obvious fluctuation of symptoms with weather or environmental changes or other aggravating or alleviating factors except as outlined above   Current Medications, Allergies, Complete Past Medical History, Past Surgical History, Family History, and Social History were reviewed in [employer] record.              Review of Systems  Constitutional: Positive for appetite change. Negative for fever, chills, activity change and unexpected weight change.  HENT: Positive for sneezing. Negative for congestion, dental problem, postnasal drip, rhinorrhea, sore  throat, trouble swallowing and voice change.   Eyes: Negative for visual disturbance.  Respiratory: Positive for cough. Negative for choking and shortness of breath.   Cardiovascular: Negative for chest pain and leg swelling.  Gastrointestinal: Negative for nausea, vomiting and abdominal pain.  Genitourinary: Negative for difficulty urinating.  Musculoskeletal: Negative for arthralgias.  Skin: Negative for rash.  Psychiatric/Behavioral: Negative for behavioral problems and confusion.       Objective:   Physical Exam amb wm nad  Wt Readings from Last 3 Encounters:  01/22/15 108 lb (48.988 kg)  01/19/15 109 lb 12.8 oz (49.805 kg)  12/22/14 105 lb (47.628 kg)    Vital signs reviewed   HEENT: edentulous/ nl  turbinates, and orophanx. Nl external ear canals without cough reflex   NECK :  without JVD/Nodes/TM/ nl carotid upstrokes bilaterally   LUNGS: no acc muscle use, clear to A and P bilaterally without cough on insp or exp maneuvers   CV:  RRR  no s3 or murmur or increase in P2, no edema   ABD:  soft and nontender with nl excursion in the supine position. No bruits or organomegaly, bowel sounds nl  MS:  warm without deformities, calf tenderness, cyanosis or clubbing  SKIN: warm and dry without lesions    NEURO:  alert, approp, no deficits    CXR PA and Lateral:   01/22/2015 :     I personally reviewed images and agree  with radiology impression as follows:    No active disease. Mild hyperinflation. Stable postsurgical changes left lower lobe posteromedial.       Assessment & Plan:

## 2015-01-23 ENCOUNTER — Telehealth: Payer: Self-pay | Admitting: Physician Assistant

## 2015-01-23 ENCOUNTER — Encounter (HOSPITAL_COMMUNITY)
Admission: RE | Admit: 2015-01-23 | Discharge: 2015-01-23 | Disposition: A | Discharge status: Home / Self Care | Payer: Medicare Other | Source: Ambulatory Visit | Attending: Physician Assistant | Admitting: Physician Assistant

## 2015-01-23 ENCOUNTER — Other Ambulatory Visit: Payer: Self-pay | Admitting: Internal Medicine

## 2015-01-23 ENCOUNTER — Encounter: Payer: Self-pay | Admitting: Internal Medicine

## 2015-01-23 DIAGNOSIS — J449 Chronic obstructive pulmonary disease, unspecified: Secondary | ICD-10-CM

## 2015-01-23 DIAGNOSIS — I2510 Atherosclerotic heart disease of native coronary artery without angina pectoris: Secondary | ICD-10-CM | POA: Diagnosis not present

## 2015-01-23 DIAGNOSIS — R55 Syncope and collapse: Secondary | ICD-10-CM

## 2015-01-23 DIAGNOSIS — R0902 Hypoxemia: Secondary | ICD-10-CM | POA: Insufficient documentation

## 2015-01-23 LAB — NM MYOCAR MULTI W/SPECT W/WALL MOTION / EF
LVDIAVOL: 57 mL
LVSYSVOL: 24 mL
RATE: 0
SDS: 1
SRS: 3
SSS: 4
TID: 0.94

## 2015-01-23 MED ORDER — TECHNETIUM TC 99M SESTAMIBI - CARDIOLITE
30.0000 | Freq: Once | INTRAVENOUS | Status: AC | PRN
  Administered 2015-01-23: 30 via INTRAVENOUS

## 2015-01-23 MED ORDER — TECHNETIUM TC 99M SESTAMIBI GENERIC - CARDIOLITE
10.0000 | Freq: Once | INTRAVENOUS | Status: AC | PRN
  Administered 2015-01-23: 10 via INTRAVENOUS

## 2015-01-23 MED ORDER — REGADENOSON 0.4 MG/5ML IV SOLN
INTRAVENOUS | Status: AC
  Filled 2015-01-23: qty 5

## 2015-01-23 MED ORDER — REGADENOSON 0.4 MG/5ML IV SOLN
0.4000 mg | Freq: Once | INTRAVENOUS | Status: AC
  Administered 2015-01-23: 0.4 mg via INTRAVENOUS

## 2015-01-23 NOTE — Assessment & Plan Note (Signed)

## 2015-01-23 NOTE — Telephone Encounter (Signed)
Patient informed of negative myoview result.   As a side note, patient states he forgot to mention he did not take Bennicar in the AM of myoview which maybe why his BP was very high.  Hilbert Corrigan PA Pager: [phone]

## 2015-01-23 NOTE — Telephone Encounter (Signed)
Patient present for outpatient [place] study currently is read-only and does not allow me to put in information.  Severely hypertensive at rest, BP 200/108 HR 81. (note BP med recently changed by both cardiology and pulmonology) Dr. Melvyn Novas stopped ACEI yesterday and change to ARB.   Resting: no CP, no SOB at rest, chronic cough for the past 4 month (neg CXR yesterday). BP 200/108 HR 81. EKG NSR with elevated J point in anterior lead, ?early repol. Borderline LVH  Stress: no CP, no SOB. No ST-T changes  Post-stress: Same as resting image. No symptom during myoview  Pending final result by [place].  Hilbert Corrigan PA Pager: [phone]

## 2015-01-23 NOTE — Assessment & Plan Note (Signed)
ACE inhibitors are problematic in  pts with airway complaints because  even experienced pulmonologists can't always distinguish ace effects from copd/asthma/pnds/ allergies etc.  By themselves they don't actually cause a problem, much like oxygen can't by itself start a fire, but they certainly serve as a powerful catalyst or enhancer for any "fire"  or inflammatory process in the upper airway, be it caused by an ET  tube or more commonly reflux (especially in the obese or pts with known GERD or who are on biphoshonates) or URI's, due to interference with bradykinin clearance.  The effects of acei on bradykinin levels occurs in 100% of pt's on acei (unless they surreptitiously stop the med!) but the classic cough is only reported in 5%.  This leaves 95% of pts on acei's  with a variety of syndromes including no identifiable symptom in most  vs non-specific symptoms that wax and wane depending on what other insult is occuring at the level of the upper airway, which may be the case here  The only way to sort it out is a 6 week trial off acei  rec benicar 20 mg daily for now/ samples given.   Also concerned about high doses of coreg here but would  hold off any change rx until returns for pfts

## 2015-01-23 NOTE — Assessment & Plan Note (Signed)
01/22/15 Patient Saturations on Room Air at Rest = 97%  Patient Saturations on Room Air while Ambulating = 81%  Patient Saturations on 2 Liters of oxygen while Ambulating = 93%  Pt declined 02 for now > recheck next ov

## 2015-01-23 NOTE — Assessment & Plan Note (Addendum)
Despite smoking hx and p partial lobectomy his Symptoms are markedly disproportionate to objective findings and not clear this is a lung problem but pt does appear to have difficult airway management issues. DDX of  difficult airways management all start with A and  include Adherence, Ace Inhibitors, Acid Reflux, Active Sinus Disease, Alpha 1 Antitripsin deficiency, Anxiety masquerading as Airways dz,  ABPA,  allergy(esp in young), Aspiration (esp in elderly), Adverse effects of meds,  Active smokers, A bunch of PE's (a small clot burden can't cause this syndrome unless there is already severe underlying pulm or vascular dz with poor reserve) plus two Bs  = Bronchiectasis and Beta blocker use..and one C= CHF  Adherence is always the initial "prime suspect" and is a multilayered concern that requires a "trust but verify" approach in every patient - starting with knowing how to use medications, especially inhalers, correctly, keeping up with refills and understanding the fundamental difference between maintenance and prns vs those medications only taken for a very short course and then stopped and not refilled.   ACEi at top of usual list of suspects here > only way to know is try off > see hbp  ? BB effects > coreg in high doses likely to contribute to bronchospasm but I see no evidence for that at present > consider later  ? Allergy/asthma > doubt/ hold off on inhalers for now   I had an extended discussion with the patient reviewing all relevant studies completed to date and  lasting  60 m Each maintenance medication was reviewed in detail including most importantly the difference between maintenance and as needed and under what circumstances the prns are to be used.  Please see instructions for details which were reviewed in writing and the patient given a copy.

## 2015-01-23 NOTE — Assessment & Plan Note (Addendum)
Needs pfts / see smoking a/p

## 2015-01-26 ENCOUNTER — Other Ambulatory Visit: Payer: Self-pay

## 2015-01-27 ENCOUNTER — Ambulatory Visit (HOSPITAL_COMMUNITY): Payer: Medicare Other | Attending: Internal Medicine

## 2015-01-27 ENCOUNTER — Ambulatory Visit (INDEPENDENT_AMBULATORY_CARE_PROVIDER_SITE_OTHER): Payer: Medicare Other

## 2015-01-27 ENCOUNTER — Other Ambulatory Visit: Payer: Self-pay

## 2015-01-27 ENCOUNTER — Telehealth: Payer: Self-pay | Admitting: Physician Assistant

## 2015-01-27 DIAGNOSIS — R55 Syncope and collapse: Secondary | ICD-10-CM

## 2015-01-27 MED ORDER — AMLODIPINE BESYLATE 2.5 MG PO TABS: 2.5000 mg | ORAL_TABLET | Freq: Every day | ORAL | Status: DC

## 2015-01-27 NOTE — Telephone Encounter (Addendum)
Spoke with pt daughter she is worried as father BP has increased since taking the Coreg 12.'5mg'$  BID that he will need to increase his dose.  Pt last 3 morning BP readings: 6/26 172/89; 6/27 198/105; 6/28 174/99; where as before making the medication change during last OV with Kathleen Argue pt home morning BP: 137/80; 159/95; 129/73.  Reviewed with Flex, D. Dunn, recommended that pt continue same dose of Coreg 12.5 BID and add Amlodipine 2.'5mg'$  QD (order already in system) and call our office in a week if BP does not get better, as can be increased to 5 mg QD.  Called daughter to give new medication orders, Left message to call back  Received call back from daughter, agrees with plans to start new medication with her dad.  Requested that she call our office in week and give [place] an update on BP and HR readings and educated on things to look for if BP to high or low.  She agreed no additional questions at this time.  During call she was able to give me pt last few HR readings: 97, 90, 102, 86, 84, and 88.

## 2015-01-27 NOTE — Telephone Encounter (Signed)
Pt c/o medication issue:  1. Name of Medication: Coreg  2. How are you currently taking this medication (dosage and times per day)? 25 mg tab 1/2 in am and 1/2 at night  3. Are you having a reaction (difficulty breathing--STAT)? No  4. What is your medication issue? Pt's daughter calling stating that since dosage has been changed to just the half a tab he doesn't feel as well throughout the day and his bp is just a little high. Pt wants to know if he can go back to the 1 tab in the am and 1 tab at night. Please call back and advise.

## 2015-01-28 ENCOUNTER — Telehealth: Payer: Self-pay | Admitting: *Deleted

## 2015-01-28 NOTE — Telephone Encounter (Signed)
I tried pt back to go over results and pt was available and has been notified of echo results by phone with understanding. Pt advised I did not see that stress test has been read but once read we will call w/those results as well, pt has monitor on now.

## 2015-01-28 NOTE — Telephone Encounter (Signed)
DPR ok to detailed lmom; Echo stable with good pumping action of heart. If any questions can either call me back in the [place] office today [phone] or may call tomorrow in our [address] office [phone].

## 2015-01-30 ENCOUNTER — Encounter: Payer: Self-pay | Admitting: Physician Assistant

## 2015-01-30 NOTE — Telephone Encounter (Signed)
Agree [name], PA-C   01/30/2015 1:50 PM

## 2015-02-03 ENCOUNTER — Encounter: Payer: Self-pay | Admitting: Physician Assistant

## 2015-02-19 ENCOUNTER — Other Ambulatory Visit: Payer: Self-pay | Admitting: Physician Assistant

## 2015-02-19 MED ORDER — AMLODIPINE BESYLATE 2.5 MG PO TABS: 2.5000 mg | ORAL_TABLET | Freq: Every day | ORAL | Status: DC

## 2015-03-09 ENCOUNTER — Ambulatory Visit: Payer: Medicare Other | Admitting: Internal Medicine

## 2015-03-12 NOTE — Progress Notes (Signed)
Cardiology Office Note   Date:  03/13/2015   ID:  Uthman, Mroczkowski 08-02-1932, MRN [id number]  PCP:  No PCP Per Patient  Cardiologist:  Dr. Casandra Doffing     Chief Complaint  Patient presents with  . Follow-up    CAD, syncope, HTN     History of Present Illness: Derek Jordan is a 81 y.o. male with a hx of CAD status post inferior Non-STEMI in 06/2004 treated with a Cypher DES to the mid RCA, lung CA status post resection/chemotherapy/radiation, severe PAD, AAA, carotid stenosis, renal artery stenosis (>60% L RAS in the past), HL, ongoing tobacco abuse.  Previously followed by Dr. Glori Bickers.  Now followed by Dr. Casandra Doffing.  FU abdominal US 6/16 demonstrated increased size of AAA (3.9 x 4.4 cm).  FU recommended in 6 mos.    I saw him 01/19/15. He returns for follow-up but complained of recurrent episodes of syncope. Episodes generally occurred around the time of using the bathroom and sounded most consistent with post micturition syncope. Echocardiogram was obtained and demonstrated normal LV function, mild diastolic dysfunction and moderately elevated pulmonary pressures. Nuclear stress test demonstrated normal perfusion. Event monitor demonstrated normal sinus rhythm, PVCs and no significant arrhythmias. Blood pressure medications have been adjusted since that time. I had him follow-up with pulmonology. He saw Dr. Melvyn Novas. His ACE inhibitor was stopped and replaced with an ARB. He was given a course of prednisone. He was noted to have hypoxia with ambulation. Follow-up is planned.  He returns for follow-up.  Here with his daughter.  He is doing much better.  Breathing is improved.  He denies further syncope.  No orthopnea, PND, edema.  No chest pain.  Still smoking.    Studies/Reports Reviewed Today:  Myoview 01/23/15 Normal perfusion, EF 57%; normal study  Echo 01/27/15 Mild LVH, EF 55-60%, normal wall motion, grade 1 diastolic dysfunction, trivial TR, PASP 39 mmHg  Event  monitor 01/19/15 NSR, PVCs, no A. fib noted, no significant pauses  Abdominal US 01/02/15 Very irregular shaped abdominal aorta, with dilatation from the mid aorta through the iliac bifurcation.  Dimensions have increased compared to previous, with the largest measurement at the level of the IMA, 3.9 cm x 4.4 cm.  There is intramural thrombus noted, and some calcification.  Normal caliber common and external iliac arteries, bilaterally. Chronic left external iliac artery occlusion. >> f/u 6 months  Carotid US 12/30/10 Bilateral ICA 0-39%  R Thyroid nodule >> need dedicated thyroid US FU 1 year  LHC 06/09/04 LM:  Ok LAD:  prox 40% LCx:  MOM 40% RCA:  Mid 99% with clot, mid 25% EF 60% PCI:  3.5 x 28.0-mm CYPHER DES to mid RCA   Past Medical History  Diagnosis Date  . Myocardial infarct 06/2004    s/p Cypher DES to RCA  . CAD (coronary artery disease)     a. LHC 11/05 at time of MI:  pLAD 40, MOM 40, mRCA 99, EF 60% >> 3.5 x 28 mm Cypher DES to mRCA;  b. Myoview 6/16:  normal perfusion, EF 57%  . Hx of radiation therapy   . Thyroid nodule   . Adenocarcinoma     Lung CA s/p resection, chemoTx, XR Tx  . Carotid artery stenosis     a. Carotid US 5/12:  bilat 0-39%  . COPD (chronic obstructive pulmonary disease)     Dr. Melvyn Novas  . HTN (hypertension)   . PVD (peripheral vascular disease)   .  Hyperlipidemia   . Renal artery stenosis     a. hx of L RAS > 60%  . History of echocardiogram     Echo 6/16: Mild LVH, EF 55-60%, normal wall motion, grade 1 diastolic dysfunction, trivial TR, PASP 39 mmHg  . Mediastinal lymphadenopathy   . History of cardiac monitoring     Event Monitor 6/16:  NSR, PVCs, no A. fib noted, no significant pauses  . AAA (abdominal aortic aneurysm)     a. Abd [place] 6/16:  3.9 x 4.4 cm with some intraluminal thrombus, chronic L EIA occlusion >> FU 6 mos  . Tobacco abuse     Past Surgical History  Procedure Laterality Date  . Biopsy thyroid  03-13-2007  .  Thoracoscopic surgical,wedge resection (05-01-2007)    . Tonsillectomy    . Coronary artery disease, s/p ptca (06-09-2004)       Current Outpatient Prescriptions  Medication Sig Dispense Refill  . amLODipine (NORVASC) 2.5 MG tablet Take 1 tablet (2.5 mg total) by mouth daily. 90 tablet 1  . aspirin 81 MG tablet Take 81 mg by mouth daily.      [name] atorvastatin (LIPITOR) 40 MG tablet Take 1 tablet (40 mg total) by mouth daily. 90 tablet 3  . carvedilol (COREG) 25 MG tablet Take 1/2 tablet (12.5 mg) by mouth twice daily 90 tablet 3  . cetirizine (ZYRTEC) 10 MG tablet Take 10 mg by mouth daily.      [name] olmesartan (BENICAR) 20 MG tablet Take 1 tablet (20 mg total) by mouth daily. 90 tablet 3   No current facility-administered medications for this visit.    Allergies:   Review of patient's allergies indicates no known allergies.    Social History:  The patient  reports that he has been smoking Cigarettes.  He has a 112.5 pack-year smoking history. He has never used smokeless tobacco. He reports that he does not drink alcohol or use illicit drugs.   Family History:  The patient's family history includes Diabetes in his mother; Heart attack in his father and another family member; Heart failure in his father; Hypertension in his father; Lung cancer in his brother; Stroke in his sister.    ROS:   Please see the history of present illness.   Review of Systems  Musculoskeletal: Positive for joint pain.  All other systems reviewed and are negative.     PHYSICAL EXAM: VS:  BP 132/74 mmHg  Pulse 60  Ht '5\' 5"'$  (1.651 m)  Wt 107 lb 6.4 oz (48.716 kg)  BMI 17.87 kg/m2    Wt Readings from Last 3 Encounters:  03/13/15 107 lb 6.4 oz (48.716 kg)  01/22/15 108 lb (48.988 kg)  01/19/15 109 lb 12.8 oz (49.805 kg)     GEN: thin frail elderly male, in no acute distress HEENT: normal Neck: no JVD,  no masses Cardiac:  Normal S1/S2, RRR; no murmur, no rubs or gallops, no edema   Respiratory:   Decreased breath sounds, no wheezing, rhonchi or rales. GI: soft, nontender, nondistended, + BS MS: no deformity or atrophy Skin: warm and dry  Neuro:  CNs II-XII intact, Strength and sensation are intact Psych: Normal affect   EKG:  EKG is not ordered today.  It demonstrates:   N/a    Recent Labs: 12/22/2014: ALT 14; BUN 16; Creatinine, Ser 0.96; Hemoglobin 14.0; Platelets 214.0; Potassium 4.7; Sodium 132*    Lipid Panel    Component Value Date/Time   CHOL 111 12/22/2014 1242  TRIG 41.0 12/22/2014 1242   HDL 48.40 12/22/2014 1242   CHOLHDL 2 12/22/2014 1242   VLDL 8.2 12/22/2014 1242   LDLCALC 54 12/22/2014 1242      ASSESSMENT AND PLAN:  Syncope:    Etiology not entirely clear. As noted previously, symptoms sounded most consistent with post micturition syncope.  He has not had a recurrence.  Workup has been unremarkable. Event monitor demonstrated no significant arrhythmias. Echocardiogram demonstrated normal LV function. His Myoview study demonstrated normal perfusion. He should continue to observe 6 months without driving since his last episode of syncope.    Essential hypertension:  Much better controlled.   Continue current Rx.   Coronary artery disease:   No angina.  Recent Myoview low risk. Continue aspirin, statin, beta blocker.   AAA (abdominal aortic aneurysm):  Follow-up planned in December 2016.  Carotid stenosis, bilateral:   Last study in 2012.  Arrange follow-up carotid US Dec 2016 when he gets his AAA [place].  Mixed hyperlipidemia:  Lipids controlled. Continue current therapy.  Tobacco abuse:  He admits he will never quit.  COPD:  Continue follow-up with pulmonology as planned.      Medication Changes: Current medicines are reviewed at length with the patient today.  Concerns regarding medicines are as outlined above.  The following changes have been made:   Discontinued Medications   PREDNISONE (DELTASONE) 10 MG TABLET    Take 4 tabs x 2 days, then 2  tabs x 2 days, then 1 tab x 2 days, then stop.   Modified Medications   Modified Medication Previous Medication   CARVEDILOL (COREG) 25 MG TABLET carvedilol (COREG) 25 MG tablet      Take 1/2 tablet (12.5 mg) by mouth twice daily    Take 1/2 tablet (12.5 mg) by mouth twice daily   OLMESARTAN (BENICAR) 20 MG TABLET olmesartan (BENICAR) 20 MG tablet      Take 1 tablet (20 mg total) by mouth daily.    Take 1 tablet (20 mg total) by mouth daily.   New Prescriptions   No medications on file    Labs/ tests ordered today include:   No orders of the defined types were placed in this encounter.     Disposition:   FU Dr. Casandra Doffing 6 mos.     Signed, Versie Starks, MHS 03/13/2015 9:08 AM    [facility] [place], [place], [place]  [address] Phone: [phone]; Fax: [phone]

## 2015-03-13 ENCOUNTER — Ambulatory Visit (INDEPENDENT_AMBULATORY_CARE_PROVIDER_SITE_OTHER): Payer: Medicare Other | Admitting: Physician Assistant

## 2015-03-13 ENCOUNTER — Encounter: Payer: Self-pay | Admitting: Internal Medicine

## 2015-03-13 ENCOUNTER — Ambulatory Visit (INDEPENDENT_AMBULATORY_CARE_PROVIDER_SITE_OTHER): Payer: Medicare Other | Admitting: Internal Medicine

## 2015-03-13 ENCOUNTER — Encounter: Payer: Self-pay | Admitting: Physician Assistant

## 2015-03-13 VITALS — BP 132/74 | HR 60 | Ht 65.0 in | Wt 107.4 lb

## 2015-03-13 VITALS — BP 118/70 | HR 88 | Ht 65.0 in | Wt 108.0 lb

## 2015-03-13 DIAGNOSIS — I701 Atherosclerosis of renal artery: Secondary | ICD-10-CM

## 2015-03-13 DIAGNOSIS — F17210 Nicotine dependence, cigarettes, uncomplicated: Secondary | ICD-10-CM

## 2015-03-13 DIAGNOSIS — I714 Abdominal aortic aneurysm, without rupture, unspecified: Secondary | ICD-10-CM

## 2015-03-13 DIAGNOSIS — J449 Chronic obstructive pulmonary disease, unspecified: Secondary | ICD-10-CM

## 2015-03-13 DIAGNOSIS — I10 Essential (primary) hypertension: Secondary | ICD-10-CM

## 2015-03-13 DIAGNOSIS — I6523 Occlusion and stenosis of bilateral carotid arteries: Secondary | ICD-10-CM

## 2015-03-13 DIAGNOSIS — Z720 Tobacco use: Secondary | ICD-10-CM

## 2015-03-13 DIAGNOSIS — E782 Mixed hyperlipidemia: Secondary | ICD-10-CM

## 2015-03-13 DIAGNOSIS — I2510 Atherosclerotic heart disease of native coronary artery without angina pectoris: Secondary | ICD-10-CM | POA: Diagnosis not present

## 2015-03-13 DIAGNOSIS — R55 Syncope and collapse: Secondary | ICD-10-CM | POA: Diagnosis not present

## 2015-03-13 LAB — PULMONARY FUNCTION TEST
DL/VA % PRED: 68 %
DL/VA: 2.89 ml/min/mmHg/L
DLCO unc % pred: 40 %
DLCO unc: 10.48 ml/min/mmHg
FEF 25-75 Post: 0.27 L/sec
FEF 25-75 Pre: 0.24 L/sec
FEF2575-%CHANGE-POST: 14 %
FEF2575-%Pred-Post: 17 %
FEF2575-%Pred-Pre: 15 %
FEV1-%CHANGE-POST: 13 %
FEV1-%PRED-POST: 28 %
FEV1-%Pred-Pre: 24 %
FEV1-PRE: 0.56 L
FEV1-Post: 0.63 L
FEV1FVC-%Change-Post: -3 %
FEV1FVC-%PRED-PRE: 64 %
FEV6-%Change-Post: 17 %
FEV6-%Pred-Post: 48 %
FEV6-%Pred-Pre: 41 %
FEV6-POST: 1.43 L
FEV6-PRE: 1.22 L
FEV6FVC-%PRED-PRE: 108 %
FEV6FVC-%Pred-Post: 108 %
FVC-%Change-Post: 17 %
FVC-%PRED-PRE: 38 %
FVC-%Pred-Post: 44 %
FVC-Post: 1.43 L
FVC-Pre: 1.22 L
POST FEV1/FVC RATIO: 44 %
POST FEV6/FVC RATIO: 100 %
PRE FEV6/FVC RATIO: 100 %
Pre FEV1/FVC ratio: 45 %

## 2015-03-13 MED ORDER — CARVEDILOL 25 MG PO TABS: ORAL_TABLET | ORAL | Status: DC

## 2015-03-13 MED ORDER — OLMESARTAN MEDOXOMIL 20 MG PO TABS: 20.0000 mg | ORAL_TABLET | Freq: Every day | ORAL | Status: DC

## 2015-03-13 MED ORDER — OLMESARTAN MEDOXOMIL 40 MG PO TABS: ORAL_TABLET | ORAL | Status: DC

## 2015-03-13 MED ORDER — BISOPROLOL FUMARATE 5 MG PO TABS
5.0000 mg | ORAL_TABLET | Freq: Every day | ORAL | Status: AC
Start: 2015-03-13 — End: ?

## 2015-03-13 NOTE — Assessment & Plan Note (Addendum)
-   trial off acei 01/23/2015 >> - PFTs 03/13/2015  FEV1  0.63 (28%) ratio 44 p 13% better from saba and dlco 24 corrects to 40 for alv vol  He is remarkably asymptomatic though I suspect that's because he's so inactive. Will try off non-selective bb next > see hbp  I had an extended discussion with the patient reviewing all relevant studies completed to date and  lasting 15 to 20 minutes of a 25 minute visit    Each maintenance medication was reviewed in detail including most importantly the difference between maintenance and prns and under what circumstances the prns are to be triggered using an action plan format that is not reflected in the computer generated alphabetically organized AVS.    Please see instructions for details which were reviewed in writing and the patient given a copy highlighting the part that I personally wrote and discussed at today's ov.

## 2015-03-13 NOTE — Progress Notes (Signed)
PFT performed today w/nitrogen washout.

## 2015-03-13 NOTE — Patient Instructions (Addendum)
Stop coreg and start bisoprolol 5 mg one pill   daily   Continue benicar 40 mg one half pill daily   The key is to stop smoking completely before smoking completely stops you!   Please schedule a follow up office visit in 2 weeks, sooner if needed with Tammy NP and bring your meds with you ( not a med calendar)  Add inquire how/ if using 02 on return

## 2015-03-13 NOTE — Patient Instructions (Signed)
Medication Instructions:  1. REFILL SENT IN FOR COREG AND BENICAR  Labwork: NONE  Testing/Procedures: Your physician has requested that you have a BILATERAL carotid duplex THIS CAN BE DONE THE SAME DAY YOU HAVE THE ABDOMINAL U/S. This test is an ultrasound of the carotid arteries in your neck. It looks at blood flow through these arteries that supply the brain with blood. Allow one hour for this exam. There are no restrictions or special instructions.  Follow-Up: Your physician wants you to follow-up in: [place] DR. VARANASI. You will receive a reminder letter in the mail two months in advance. If you don't receive a letter, please call our office to schedule the follow-up appointment.  Any Other Special Instructions Will Be Listed Below (If Applicable).

## 2015-03-13 NOTE — Progress Notes (Signed)
Subjective:    Patient ID: Derek Jordan, male    DOB: 1933-04-18,     MRN: [id number]    Brief patient profile:  83 yowm active smoker widower lives in [place] with no primary care doctor referred by to pulmonary eval 01/22/2015 [name] for copd eval and proved to have GOLD IV severity 03/13/15 with symptoms than improved off acei.     History of Present Illness  01/22/2015 1st [facility] office visit/ Neven Fina   Chief Complaint  Patient presents with  . Pulmonary Consult    Referred by [name], PA for eval of COPD. Pt c/o cough and SOB- progressively worse over the past year. He is SOB sometimes just sitting and with minimal exertion. Cough is prod with large amounts of sputum "sometimes clear sometimes dark".   s/p L lower lowb partial lobectomy  2008 : Adenoca  pT2, pN2, pMX  s/p RT and chemo and no doe until around 2015 on acei with indolent onset  Progressively worse  sob > slow and flat ok but  Sometimes can't even walk at a nl pace x 10 ft but varies a lot s pattern/ sometimes sob at rest/ no better with inhalers in past / assoc with severe coughing fits more day than night. rec Prednisone 10 mg take  4 each am x 2 days,   2 each am x 2 days,  1 each am x 2 days and stop  Stop lisinopril Start benicar 20 one daily  The key is to stop smoking completely before smoking completely stops you!     03/13/2015 f/u ov/Cheetara Hoge re: copd GOLD IV no maint rx/ no saba  Chief Complaint  Patient presents with  . Follow-up    PFT done today. Pt states his breathing is much improved.      Not limited by breathing from desired activities  But very inactive  No obvious day to day or daytime variability or assoc chronic cough or cp or chest tightness, subjective wheeze or overt sinus or hb symptoms. No unusual exp hx or h/o childhood pna/ asthma or knowledge of premature birth.  Sleeping ok without nocturnal  or early am exacerbation  of respiratory  c/o's or need for noct saba. Also  denies any obvious fluctuation of symptoms with weather or environmental changes or other aggravating or alleviating factors except as outlined above   Current Medications, Allergies, Complete Past Medical History, Past Surgical History, Family History, and Social History were reviewed in [employer] record.  ROS  The following are not active complaints unless bolded sore throat, dysphagia, dental problems, itching, sneezing,  nasal congestion or excess/ purulent secretions, ear ache,   fever, chills, sweats, unintended wt loss, classically pleuritic or exertional cp, hemoptysis,  orthopnea pnd or leg swelling, presyncope, palpitations, abdominal pain, anorexia, nausea, vomiting, diarrhea  or change in bowel or bladder habits, change in stools or urine, dysuria,hematuria,  rash, arthralgias, visual complaints, headache, numbness, weakness or ataxia or problems with walking or coordination,  change in mood/affect or memory.                Objective:   Physical Exam amb wm nad  03/13/2015       108  Wt Readings from Last 3 Encounters:  01/22/15 108 lb (48.988 kg)  01/19/15 109 lb 12.8 oz (49.805 kg)  12/22/14 105 lb (47.628 kg)    Vital signs reviewed   HEENT: edentulous/ nl  turbinates, and orophanx. Nl  external ear canals without cough reflex   NECK :  without JVD/Nodes/TM/ nl carotid upstrokes bilaterally   LUNGS: no acc muscle use, clear to A and P bilaterally without cough on insp or exp maneuvers   CV:  RRR  no s3 or murmur or increase in P2, no edema   ABD:  soft and nontender with nl excursion in the supine position. No bruits or organomegaly, bowel sounds nl  MS:  warm without deformities, calf tenderness, cyanosis or clubbing  SKIN: warm and dry without lesions  But very weathered esp face/arms  NEURO:  alert, approp, no deficits    CXR PA and Lateral:   01/22/2015 :     I personally reviewed images and agree with radiology impression as  follows:    No active disease. Mild hyperinflation. Stable postsurgical changes left lower lobe posteromedial.       Assessment & Plan:

## 2015-03-14 ENCOUNTER — Encounter: Payer: Self-pay | Admitting: Internal Medicine

## 2015-03-14 NOTE — Assessment & Plan Note (Signed)
Trial off acei for pseudowheezing   01/23/15 > resolved  - try off coreg for airflow obst not resp to ssaba 03/14/2015 >>>   Strongly prefer in this setting: Bystolic, the most beta -1  selective Beta blocker available in sample form, with bisoprolol the most selective generic choice  on the market.

## 2015-03-14 NOTE — Assessment & Plan Note (Signed)
>   3 m  I took an extended  opportunity with this patient to outline the consequences of continued cigarette use  in airway disorders based on all the data we have from the multiple national lung health studies (perfomed over decades at millions of dollars in cost)  indicating that smoking cessation, not choice of inhalers or physicians, is the most important aspect of care.   

## 2015-03-23 ENCOUNTER — Telehealth: Payer: Self-pay | Admitting: Adult Health

## 2015-03-23 NOTE — Telephone Encounter (Signed)
Called spoke with Levada Dy. She reports pt does have enough medication to get pt to his next appt. Nothing further needed

## 2015-03-27 ENCOUNTER — Ambulatory Visit: Payer: Medicare Other | Admitting: Adult Health

## 2015-04-03 ENCOUNTER — Ambulatory Visit (INDEPENDENT_AMBULATORY_CARE_PROVIDER_SITE_OTHER): Payer: Medicare Other | Admitting: Adult Health

## 2015-04-03 ENCOUNTER — Encounter: Payer: Self-pay | Admitting: Adult Health

## 2015-04-03 VITALS — BP 126/76 | HR 80 | Temp 97.9°F | Ht 65.0 in | Wt 112.0 lb

## 2015-04-03 DIAGNOSIS — Z23 Encounter for immunization: Secondary | ICD-10-CM | POA: Diagnosis not present

## 2015-04-03 DIAGNOSIS — Z720 Tobacco use: Secondary | ICD-10-CM

## 2015-04-03 DIAGNOSIS — J449 Chronic obstructive pulmonary disease, unspecified: Secondary | ICD-10-CM

## 2015-04-03 DIAGNOSIS — F17210 Nicotine dependence, cigarettes, uncomplicated: Secondary | ICD-10-CM

## 2015-04-03 NOTE — Patient Instructions (Signed)
Flu shot today .  Continue on current regimen  Best thing is to quit smoking  If you change your mind on medications or oxygen please let us know  follow up Dr. Melvyn Novas  In 3-4 months and As needed

## 2015-04-03 NOTE — Assessment & Plan Note (Signed)
Severe COPD and an active smoker Smoking cessation encouraged Discussed possible inhalers and/or nebulizers. However, patient declines. Patient does desaturate with ambulation. However, declined oxygen. Patient education given and patient aware of potential complications.  Plan  Flu shot today .  Continue on current regimen  Best thing is to quit smoking  If you change your mind on medications or oxygen please let us know  follow up Dr. Melvyn Novas  In 3-4 months and As needed

## 2015-04-03 NOTE — Assessment & Plan Note (Signed)
Smoking cessation  

## 2015-04-03 NOTE — Progress Notes (Signed)
Subjective:    Patient ID: Derek Jordan, male    DOB: June 12, 1933,     MRN: [id number]    Brief patient profile:  75 yowm active smoker widower lives in [place] with no primary care doctor referred by to pulmonary eval 01/22/2015 [name] for copd eval and proved to have GOLD IV severity 03/13/15 with symptoms than improved off acei.     History of Present Illness  01/22/2015 1st [facility] office visit/ Wert   Chief Complaint  Patient presents with  . Pulmonary Consult    Referred by [name], PA for eval of COPD. Pt c/o cough and SOB- progressively worse over the past year. He is SOB sometimes just sitting and with minimal exertion. Cough is prod with large amounts of sputum "sometimes clear sometimes dark".   s/p L lower lowb partial lobectomy  2008 : Adenoca  pT2, pN2, pMX  s/p RT and chemo and no doe until around 2015 on acei with indolent onset  Progressively worse  sob > slow and flat ok but  Sometimes can't even walk at a nl pace x 10 ft but varies a lot s pattern/ sometimes sob at rest/ no better with inhalers in past / assoc with severe coughing fits more day than night. rec Prednisone 10 mg take  4 each am x 2 days,   2 each am x 2 days,  1 each am x 2 days and stop  Stop lisinopril Start benicar 20 one daily  The key is to stop smoking completely before smoking completely stops you!     03/13/2015 f/u ov/Wert re: copd GOLD IV no maint rx/ no saba  Chief Complaint  Patient presents with  . Follow-up    PFT done today. Pt states his breathing is much improved.     Not limited by breathing from desired activities  But very inactive >change coreg to bisoprolol   04/03/2015 Follow up : COPD Gold IV  Returns for a three-week follow-up. Patient had been having difficulty with ongoing cough and wheezing. He was changed off of his ACE inhibitor and changed from Coreg to bisoprolol. Patient returns with family member He says that he is feeling much better with  decreased cough, wheezing and shortness of breath. He remains active in his yard with mowing. Unfortunately Patient continues to smoke. Patient has no interest in quitting smoking Patient does desaturate with walking. However, he refuses oxygen. We discussed inhalers for his severe COPD however, he says they do not help and he does not want to take any inhalers. He does agree to a flu shot today Chest x-ray in June showed chronic changes without acute process. He denies any hemoptysis, orthopnea, PND or leg swelling.      Current Medications, Allergies, Complete Past Medical History, Past Surgical History, Family History, and Social History were reviewed in [employer] record.  ROS  The following are not active complaints unless bolded sore throat, dysphagia, dental problems, itching, sneezing,  nasal congestion or excess/ purulent secretions, ear ache,   fever, chills, sweats, unintended wt loss, classically pleuritic or exertional cp, hemoptysis,  orthopnea pnd or leg swelling, presyncope, palpitations, abdominal pain, anorexia, nausea, vomiting, diarrhea  or change in bowel or bladder habits, change in stools or urine, dysuria,hematuria,  rash, arthralgias, visual complaints, headache, numbness, weakness or ataxia or problems with walking or coordination,  change in mood/affect or memory.  Objective:   Physical Exam amb wm nad elderly  03/13/2015       108 >112 04/03/2015    Vital signs reviewed   HEENT: edentulous/ nl  turbinates, and orophanx. Nl external ear canals without cough reflex   NECK :  without JVD/Nodes/TM/ nl carotid upstrokes bilaterally   LUNGS: no acc muscle use, clear to A and P bilaterally without cough on insp or exp maneuvers   CV:  RRR  no s3 or murmur or increase in P2, no edema   ABD:  soft and nontender with nl excursion in the supine position. No bruits or organomegaly, bowel sounds nl  MS:  warm without  deformities, calf tenderness, cyanosis or clubbing  SKIN: warm and dry without lesions  But very weathered esp face/arms  NEURO:  alert, approp, no deficits    CXR PA and Lateral:   01/22/2015 :       No active disease. Mild hyperinflation. Stable postsurgical changes left lower lobe posteromedial.       Assessment & Plan:

## 2015-07-02 ENCOUNTER — Ambulatory Visit: Payer: Medicare Other | Admitting: Internal Medicine

## 2015-07-09 ENCOUNTER — Other Ambulatory Visit (HOSPITAL_COMMUNITY): Payer: Medicare Other

## 2015-07-09 ENCOUNTER — Encounter (HOSPITAL_COMMUNITY): Payer: Medicare Other

## 2015-07-11 ENCOUNTER — Other Ambulatory Visit: Payer: Self-pay | Admitting: Internal Medicine

## 2015-07-17 ENCOUNTER — Encounter: Payer: Self-pay | Admitting: Internal Medicine

## 2015-07-17 ENCOUNTER — Ambulatory Visit (HOSPITAL_COMMUNITY)
Admission: RE | Admit: 2015-07-17 | Discharge: 2015-07-17 | Disposition: A | Discharge status: Home / Self Care | Payer: Medicare Other | Source: Ambulatory Visit | Attending: Physician Assistant | Admitting: Physician Assistant

## 2015-07-17 ENCOUNTER — Ambulatory Visit (HOSPITAL_COMMUNITY)
Admission: RE | Admit: 2015-07-17 | Discharge: 2015-07-17 | Disposition: A | Discharge status: Home / Self Care | Payer: Medicare Other | Source: Ambulatory Visit | Attending: Interventional Cardiology | Admitting: Interventional Cardiology

## 2015-07-17 ENCOUNTER — Ambulatory Visit (INDEPENDENT_AMBULATORY_CARE_PROVIDER_SITE_OTHER): Payer: Medicare Other | Admitting: Internal Medicine

## 2015-07-17 VITALS — BP 112/70 | HR 65 | Ht 65.0 in | Wt 114.8 lb

## 2015-07-17 DIAGNOSIS — I6523 Occlusion and stenosis of bilateral carotid arteries: Secondary | ICD-10-CM | POA: Diagnosis not present

## 2015-07-17 DIAGNOSIS — I10 Essential (primary) hypertension: Secondary | ICD-10-CM

## 2015-07-17 DIAGNOSIS — I714 Abdominal aortic aneurysm, without rupture, unspecified: Secondary | ICD-10-CM

## 2015-07-17 DIAGNOSIS — Z720 Tobacco use: Secondary | ICD-10-CM

## 2015-07-17 DIAGNOSIS — E785 Hyperlipidemia, unspecified: Secondary | ICD-10-CM | POA: Insufficient documentation

## 2015-07-17 DIAGNOSIS — J449 Chronic obstructive pulmonary disease, unspecified: Secondary | ICD-10-CM | POA: Diagnosis not present

## 2015-07-17 DIAGNOSIS — F17210 Nicotine dependence, cigarettes, uncomplicated: Secondary | ICD-10-CM | POA: Diagnosis not present

## 2015-07-17 DIAGNOSIS — F17200 Nicotine dependence, unspecified, uncomplicated: Secondary | ICD-10-CM | POA: Insufficient documentation

## 2015-07-17 DIAGNOSIS — I745 Embolism and thrombosis of iliac artery: Secondary | ICD-10-CM | POA: Diagnosis not present

## 2015-07-17 NOTE — Patient Instructions (Signed)
The key is to stop smoking completely before smoking completely stops you!   See if you can establish with [facility] for primary care  Pulmonary follow up is as needed if breathing or cough worsen

## 2015-07-17 NOTE — Assessment & Plan Note (Signed)
-   trial off acei 01/23/2015 > improved  - PFTs 03/13/2015  FEV1  0.63 (28%) ratio 44 p 13% better from saba and dlco 24 corrects to 40 for alv vol - 03/14/2015 trial off coreg and on bisoprolol > improved   I had an extended final summary discussion with the patient reviewing all relevant studies completed to date and  lasting 15 to 20 minutes of a 25 minute visit on the following issues:    1) As I explained to this patient in detail:  although there may be copd present, it may not be clinically relevant:   it does not appear to be limiting activity tolerance any more than a set of worn tires limits someone from driving a car  around a parking lot.  A new set of Michelins might look good but would have no perceived impact on the performance of the car and would not be Jaymarion the cost.  That is to say:   this pt is so sedentary I don't recommend aggressive pulmonary rx at this point unless limiting symptoms arise or acute exacerbations become as issue, neither of which is the case now.  I asked the patient to contact this office at any time in the future should either of these problems arise.    2) symptoms better on bp meds that don't spillover and cause resp cc's  3) pulmonary f/u is prn

## 2015-07-17 NOTE — Progress Notes (Signed)
Subjective:    Patient ID: Derek Jordan, male    DOB: 1932/12/11,     MRN: [id number]    Brief patient profile:  61 yowm active smoker widower lives in [place] with no primary care doctor referred by to pulmonary eval 01/22/2015 Derek Jordan for copd eval and proved to have GOLD IV severity 03/13/15 with symptoms than improved off acei and coreg     History of Present Illness  01/22/2015 1st [facility] office visit/ Wister Hoefle   Chief Complaint  Patient presents with  . Pulmonary Consult    Referred by Derek Dopp, PA for eval of COPD. Pt c/o cough and SOB- progressively worse over the past year. He is SOB sometimes just sitting and with minimal exertion. Cough is prod with large amounts of sputum "sometimes clear sometimes dark".   s/p L lower lowb partial lobectomy  2008 : Adenoca  pT2, pN2, pMX  s/p RT and chemo and no doe until around 2015 on acei with indolent onset  Progressively worse  sob > slow and flat ok but  Sometimes can't even walk at a nl pace x 10 ft but varies a lot s pattern/ sometimes sob at rest/ no better with inhalers in past / assoc with severe coughing fits more day than night. rec Prednisone 10 mg take  4 each am x 2 days,   2 each am x 2 days,  1 each am x 2 days and stop  Stop lisinopril Start benicar 20 one daily  The key is to stop smoking completely before smoking completely stops you!     03/13/2015 f/u ov/Claudell Rhody re: copd GOLD IV no maint rx/ no saba  Chief Complaint  Patient presents with  . Follow-up    PFT done today. Pt states his breathing is much improved.     Not limited by breathing from desired activities  But very inactive >change coreg to bisoprolol    07/17/2015  f/u ov/Conita Amenta re: COPD IV / no pulmonary rx  Chief Complaint  Patient presents with  . Follow-up    Pt states that his breathing is unchanged. He has had prod cough with min yellow sputum.   cough tends to be in am's but really not bothering him enough to want to cut down / quit  smoking/ worse also in winter typical of CB   Not limited by breathing from desired activities    No obvious day to day or daytime variability or assoc excess/ purulent sputum or mucus plugs    cp or chest tightness, subjective wheeze or overt sinus or hb symptoms. No unusual exp hx or h/o childhood pna/ asthma or knowledge of premature birth.  Sleeping ok without nocturnal  or early am exacerbation  of respiratory  c/o's or need for noct saba. Also denies any obvious fluctuation of symptoms with weather or environmental changes or other aggravating or alleviating factors except as outlined above   Current Medications, Allergies, Complete Past Medical History, Past Surgical History, Family History, and Social History were reviewed in [employer] record.  ROS  The following are not active complaints unless bolded sore throat, dysphagia, dental problems, itching, sneezing,  nasal congestion or excess/ purulent secretions, ear ache,   fever, chills, sweats, unintended wt loss, classically pleuritic or exertional cp, hemoptysis,  orthopnea pnd or leg swelling, presyncope, palpitations, abdominal pain, anorexia, nausea, vomiting, diarrhea  or change in bowel or bladder habits, change in stools or urine, dysuria,hematuria,  rash, arthralgias,  visual complaints, headache, numbness, weakness or ataxia or problems with walking or coordination,  change in mood/affect or memory.           Objective:   Physical Exam  amb elderly wm nad  Min rattling cough on voluntary maneuver  03/13/2015       108 >112 04/03/2015 > 07/17/2015 115    Vital signs reviewed   HEENT: edentulous/ nl  turbinates, and orophanx. Nl external ear canals without cough reflex   NECK :  without JVD/Nodes/TM/ nl carotid upstrokes bilaterally   LUNGS: no acc muscle use, distant bs without cough on insp or exp maneuvers   CV:  RRR  no s3 or murmur or increase in P2, no edema   ABD:  soft and nontender  with nl excursion in the supine position. No bruits or organomegaly, bowel sounds nl  MS:  warm without deformities, calf tenderness, cyanosis or clubbing  SKIN: warm and dry without lesions  But very weathered esp face/arms  NEURO:  alert, approp, no deficits         Assessment & Plan:   Outpatient Encounter Prescriptions as of 07/17/2015  Medication Sig  . amLODipine (NORVASC) 2.5 MG tablet Take 1 tablet (2.5 mg total) by mouth daily.  [name] aspirin 81 MG tablet Take 81 mg by mouth daily.    [name] atorvastatin (LIPITOR) 40 MG tablet Take 1 tablet (40 mg total) by mouth daily.  . bisoprolol (ZEBETA) 5 MG tablet Take 1 tablet (5 mg total) by mouth daily.  . cetirizine (ZYRTEC) 10 MG tablet Take 10 mg by mouth daily.    [name] olmesartan (BENICAR) 40 MG tablet TAKE 1/2 TABLET BY MOUTH ONCE A DAY   No facility-administered encounter medications on file as of 07/17/2015.

## 2015-07-17 NOTE — Assessment & Plan Note (Signed)
Trial off acei for pseudowheezing   01/23/15 > resolved  - try off coreg for airflow obst not resp to saba 03/14/2015 >>improved   Adequate control on present rx, reviewed > no change in rx needed  > stay of bisoprolol and off acei >  Follow up per Primary Care planned

## 2015-07-17 NOTE — Assessment & Plan Note (Signed)
>   3 min discussion  I emphasized that although we never turn away smokers from the pulmonary clinic, we do ask that they understand that the recommendations that we make  won't work nearly as well in the presence of continued cigarette exposure.  In fact, we may very well  reach a point where we can't promise to help the patient if he/she can't quit smoking. (We can and will promise to try to help, we just can't promise what we recommend will really work)   Very fatalistic about "gotta die from something" > reviewed with daughter also

## 2015-07-20 ENCOUNTER — Telehealth: Payer: Self-pay | Admitting: *Deleted

## 2015-07-20 ENCOUNTER — Encounter: Payer: Self-pay | Admitting: Physician Assistant

## 2015-07-20 NOTE — Telephone Encounter (Signed)
Pt has been notified of carotid results by phone with verbal understanding.

## 2015-07-21 ENCOUNTER — Telehealth: Payer: Self-pay

## 2015-07-21 DIAGNOSIS — I714 Abdominal aortic aneurysm, without rupture, unspecified: Secondary | ICD-10-CM

## 2015-07-21 NOTE — Telephone Encounter (Signed)
Informed patient of results and verbal understanding expressed.  Repeat duplex ordered to be scheduled in 1 year. Patient agrees with treatment plan.

## 2015-07-21 NOTE — Telephone Encounter (Signed)
-----   Message from Jettie Booze, MD sent at 07/20/2015  6:21 PM EST ----- Stable AAA.  Repeat study in one year.

## 2016-07-19 ENCOUNTER — Ambulatory Visit (HOSPITAL_COMMUNITY)
Admission: RE | Admit: 2016-07-19 | Discharge: 2016-07-19 | Disposition: A | Discharge status: Home / Self Care | Payer: Medicare Other | Source: Ambulatory Visit | Attending: Cardiology | Admitting: Cardiology

## 2016-07-19 DIAGNOSIS — I714 Abdominal aortic aneurysm, without rupture, unspecified: Secondary | ICD-10-CM

## 2016-07-19 DIAGNOSIS — I7092 Chronic total occlusion of artery of the extremities: Secondary | ICD-10-CM | POA: Diagnosis not present

## 2016-07-19 DIAGNOSIS — I700 Atherosclerosis of aorta: Secondary | ICD-10-CM | POA: Insufficient documentation

## 2016-07-20 ENCOUNTER — Telehealth: Payer: Self-pay | Admitting: Physician Assistant

## 2016-07-20 NOTE — Telephone Encounter (Signed)
Walk In pt Form-[employer] Parking [employer]ac[employer]-dropped off placed in [employer].

## 2016-08-29 NOTE — Progress Notes (Signed)
Patient ID: Derek Jordan, male   DOB: [birth year], 83 y.o.   MRN: [id number]     Cardiology Office Note   Date:  08/30/2016   ID:  Derek Jordan, Derek Jordan 06-23-1933, MRN [id number]  PCP:  Derek Bender, PA-C Dr. York Jordan: phone [phone], fax [phone]   No chief complaint on file.  CAD, MI  Wt Readings from Last 3 Encounters:  08/30/16 111 lb (50.3 kg)  07/17/15 114 lb 12.8 oz (52.1 kg)  04/03/15 112 lb (50.8 kg)       History of Present Illness: Derek Jordan is a 83 y.o. male  with a history of single-vessel coronary artery disease, status post acute inferior wall myocardial infarction treated with Cypher drug-eluting stent to the mid right coronary artery in 2005, 3.5 x 28 mm. His LV function was normal. There was mild noncritical disease elsewhere. He also has a history of lung CA (s/p resection/chemo/XRT), hyperlipidemia, severe peripheral vascular disease with a small infrarenal abdominal aneurysm, carotid stenosis and greater than 60% left renal artery stenosis by ultrasound.  His sx was syncope with the MI.  He was moving a load of hay.  No chest pain, pressure or tightness.  His only sx now is DOE.  HE attributes that to his lung disease and surgery.  He still smokes.  He smokes 1-1.5 packs per day.  His breathing limits him.  He does not walk regularly.  He tries to remain active outside when the weather.    His BP at home runs about 140/80 at home, sometimes higher.    He has a known AAA which is up to 4.5 cm.  He denies sx like his prior MI.        Past Medical History:  Diagnosis Date  . AAA (abdominal aortic aneurysm) ([place])    a. Abd [place] 6/16:  3.9 x 4.4 cm with some intraluminal thrombus, chronic L EIA occlusion >> FU 6 mos  . Adenocarcinoma ([place])    Lung CA s/p resection, chemoTx, XR Tx  . CAD (coronary artery disease)    a. LHC 11/05 at time of MI:  pLAD 40, MOM 40, mRCA 99, EF 60% >> 3.5 x 28 mm Cypher DES to mRCA;  b. Myoview 6/16:  normal  perfusion, EF 57%  . Carotid artery stenosis    a. Carotid US 5/12:  bilat 0-39%;  b. Carotid US 12/16:  Bilateral ICA 1-39%  . COPD (chronic obstructive pulmonary disease) ([facility])    Dr. Melvyn Novas  . History of cardiac monitoring    Event Monitor 6/16:  NSR, PVCs, no A. fib noted, no significant pauses  . History of echocardiogram    Echo 6/16: Mild LVH, EF 55-60%, normal wall motion, grade 1 diastolic dysfunction, trivial TR, PASP 39 mmHg  . HTN (hypertension)   . Hx of radiation therapy   . Hyperlipidemia   . Mediastinal lymphadenopathy   . Myocardial infarct 06/2004   s/p Cypher DES to RCA  . PVD (peripheral vascular disease) ([place])   . Renal artery stenosis ([facility])    a. hx of L RAS > 60%  . Thyroid nodule   . Tobacco abuse     Past Surgical History:  Procedure Laterality Date  . BIOPSY THYROID  03-13-2007  . CORONARY ARTERY DISEASE, S/P PTCA (06-09-2004)    . THORACOSCOPIC SURGICAL,WEDGE RESECTION (05-01-2007)    . TONSILLECTOMY       Current Outpatient Prescriptions  Medication Sig Dispense Refill  .  amLODipine (NORVASC) 2.5 MG tablet Take 1 tablet (2.5 mg total) by mouth daily. 90 tablet 1  . aspirin 81 MG tablet Take 81 mg by mouth daily.      [name] atorvastatin (LIPITOR) 40 MG tablet Take 1 tablet (40 mg total) by mouth daily. 90 tablet 3  . bisoprolol (ZEBETA) 5 MG tablet Take 1 tablet (5 mg total) by mouth daily. 30 tablet 11  . cetirizine (ZYRTEC) 10 MG tablet Take 10 mg by mouth daily.      . Multiple Vitamin (ONE-A-DAY MENS PO) Take by mouth daily.    [name] olmesartan (BENICAR) 40 MG tablet TAKE 1/2 TABLET BY MOUTH ONCE A DAY 15 tablet 3   No current facility-administered medications for this visit.     Allergies:   Patient has no known allergies.    Social History:  The patient  reports that he has been smoking Cigarettes.  He has a 112.50 pack-year smoking history. He has never used smokeless tobacco. He reports that he does not drink alcohol or use drugs.   Family  History:  The patient's *family history includes Diabetes in his mother; Heart attack in his father; Heart failure in his father; Hypertension in his father; Lung cancer in his brother; Stroke in his sister.    ROS:  Please see the history of present illness.   Otherwise, review of systems are positive for DOE.   All other systems are reviewed and negative.    PHYSICAL EXAM: VS:  BP 140/80 (BP Location: Left Arm, Patient Position: Sitting, Cuff Size: Normal)   Pulse 68   Ht '5\' 5"'$  (1.651 m)   Wt 111 lb (50.3 kg)   BMI 18.47 kg/m  , BMI Body mass index is 18.47 kg/m. GEN: THin, frail, in no acute distress  HEENT: normal  Neck: no JVD, carotid bruits, or masses Cardiac: *RRR; no murmurs, rubs, or gallops,no edema , 2+ radial pulses bilaterally Respiratory:  clear to auscultation bilaterally, normal work of breathing GI: soft, nontender, nondistended, + BS MS: no deformity or atrophy  Skin: warm and dry, no rash Neuro:  Strength and sensation are intact Psych: euthymic mood, full affect   EKG:   The ekg ordered today demonstrates NSR, PVC, inferolateral ST changes- slightly more pronounced- but no sx   Recent Labs: No results found for requested labs within last 8760 hours.   Lipid Panel    Component Value Date/Time   CHOL 111 12/22/2014 1242   TRIG 41.0 12/22/2014 1242   HDL 48.40 12/22/2014 1242   CHOLHDL 2 12/22/2014 1242   VLDL 8.2 12/22/2014 1242   LDLCALC 54 12/22/2014 1242     Other studies Reviewed: Additional studies/ records that were reviewed today with results demonstrating: Cath report : 3.5 x 28 stent to RCA by Dr. Albertine Patricia in 2005.[name]  Normla EF in 2016.  No ischemia by stress test.   ASSESSMENT AND PLAN:  1. CAD/Inferior MI: s/p RCA stent as above, placed in 2005.  Continue aspirin.  He has been off of Plavix for several years. We stressed the importance of aggressive secondary prevention including a healthy diet, avoiding tobacco and taking lipid-lowering  therapy. Refill atorvastatin. Will check lipids today since he is fasting. We'll check electrolytes, liver function tests and CBC as well. 2. HTN: BP increased at home. Increase amlodipine to 5 mg daily. 3. Tobacco abuse: I stressed the importance of stopping smoking. He has cut back to 1 ppd but he cannot quit completely. He gets  intense cravings after a day of missing a cigarette. He states that he has been smoking since age 42. 84. AAA: Small aneurysm noted in 2012. Increased to 4.5 cm in 12/17.  I reviewed last report.  He will need a AAA u/s in June 2018. 5. I also spoke about the importance of drinking more water and decreasing his caffeine intake. He drinks large amounts of caffeinated tea. 6. Dyspnea on exertion: Likely related to COPD.  No further syncope associated with urinating.   Current medicines are reviewed at length with the patient today.  The patient concerns regarding his medicines were addressed.  The following changes have been made:  No change  Labs/ tests ordered today include:   No orders of the defined types were placed in this encounter.   Recommend 150 minutes/week of aerobic exercise Low fat, low carb, high fiber diet recommended  Disposition:   FU in 1 year   Signed, Larae Grooms, MD  08/30/2016 2:56 PM    [facility] [address], [place], [place]  [address] Phone: [phone]; Fax: [phone]

## 2016-08-30 ENCOUNTER — Encounter: Payer: Self-pay | Admitting: Interventional Cardiology

## 2016-08-30 ENCOUNTER — Ambulatory Visit (INDEPENDENT_AMBULATORY_CARE_PROVIDER_SITE_OTHER): Payer: Medicare Other | Admitting: Interventional Cardiology

## 2016-08-30 VITALS — BP 140/80 | HR 68 | Ht 65.0 in | Wt 111.0 lb

## 2016-08-30 DIAGNOSIS — I252 Old myocardial infarction: Secondary | ICD-10-CM

## 2016-08-30 DIAGNOSIS — I10 Essential (primary) hypertension: Secondary | ICD-10-CM | POA: Diagnosis not present

## 2016-08-30 DIAGNOSIS — I714 Abdominal aortic aneurysm, without rupture, unspecified: Secondary | ICD-10-CM

## 2016-08-30 DIAGNOSIS — I2510 Atherosclerotic heart disease of native coronary artery without angina pectoris: Secondary | ICD-10-CM | POA: Diagnosis not present

## 2016-08-30 DIAGNOSIS — Z720 Tobacco use: Secondary | ICD-10-CM | POA: Diagnosis not present

## 2016-08-30 MED ORDER — AMLODIPINE BESYLATE 5 MG PO TABS: 5.0000 mg | ORAL_TABLET | Freq: Every day | ORAL | 1 refills | Status: DC

## 2016-08-30 NOTE — Patient Instructions (Signed)
**Note De-Identified Epimenio Schetter Obfuscation** Medication Instructions:  Increase Amlodipine to 5 mg daily. All of your remaining medications remain the same.  Labwork: None  Testing/Procedures: None  Follow-Up: Your physician wants you to follow-up in: 1 year. You will receive a reminder letter in the mail two months in advance. If you don't receive a letter, please call our office to schedule the follow-up appointment.     If you need a refill on your cardiac medications before your next appointment, please call your pharmacy.

## 2016-10-07 ENCOUNTER — Telehealth: Payer: Self-pay | Admitting: Interventional Cardiology

## 2016-10-07 MED ORDER — AMLODIPINE BESYLATE 5 MG PO TABS: 5.0000 mg | ORAL_TABLET | Freq: Every day | ORAL | 2 refills | Status: AC

## 2016-10-07 NOTE — Telephone Encounter (Signed)
New message       *STAT* If patient is at the pharmacy, call can be transferred to refill team.   1. Which medications need to be refilled? (please list name of each medication and dose if known)  Amlodipine '5mg'$  2. Which pharmacy/location (including street and city if local pharmacy) is medication to be sent to?  [facility] [place] 3. Do they need a 30 day or 90 day supply? 90 day

## 2017-02-02 ENCOUNTER — Other Ambulatory Visit: Payer: Self-pay | Admitting: Interventional Cardiology

## 2017-02-02 DIAGNOSIS — I714 Abdominal aortic aneurysm, without rupture, unspecified: Secondary | ICD-10-CM

## 2017-02-07 ENCOUNTER — Ambulatory Visit (HOSPITAL_COMMUNITY)
Admission: RE | Admit: 2017-02-07 | Discharge: 2017-02-07 | Disposition: A | Discharge status: Home / Self Care | Payer: Medicare Other | Source: Ambulatory Visit | Attending: Cardiovascular Disease | Admitting: Cardiovascular Disease

## 2017-02-07 DIAGNOSIS — I714 Abdominal aortic aneurysm, without rupture, unspecified: Secondary | ICD-10-CM

## 2017-02-07 DIAGNOSIS — I739 Peripheral vascular disease, unspecified: Secondary | ICD-10-CM | POA: Insufficient documentation

## 2017-02-07 DIAGNOSIS — E785 Hyperlipidemia, unspecified: Secondary | ICD-10-CM | POA: Diagnosis not present

## 2017-02-07 DIAGNOSIS — Z8673 Personal history of transient ischemic attack (TIA), and cerebral infarction without residual deficits: Secondary | ICD-10-CM | POA: Insufficient documentation

## 2017-02-07 DIAGNOSIS — I2510 Atherosclerotic heart disease of native coronary artery without angina pectoris: Secondary | ICD-10-CM | POA: Insufficient documentation

## 2017-02-07 DIAGNOSIS — I10 Essential (primary) hypertension: Secondary | ICD-10-CM | POA: Insufficient documentation

## 2017-02-07 DIAGNOSIS — Z720 Tobacco use: Secondary | ICD-10-CM | POA: Diagnosis not present

## 2017-02-13 ENCOUNTER — Other Ambulatory Visit: Payer: Self-pay

## 2017-02-13 DIAGNOSIS — I714 Abdominal aortic aneurysm, without rupture, unspecified: Secondary | ICD-10-CM

## 2017-03-14 ENCOUNTER — Encounter: Payer: Self-pay | Admitting: Vascular Surgery

## 2017-03-23 ENCOUNTER — Ambulatory Visit (INDEPENDENT_AMBULATORY_CARE_PROVIDER_SITE_OTHER): Payer: Medicare Other | Admitting: Vascular Surgery

## 2017-03-23 ENCOUNTER — Encounter: Payer: Self-pay | Admitting: Vascular Surgery

## 2017-03-23 ENCOUNTER — Encounter: Payer: Medicare Other | Admitting: Vascular Surgery

## 2017-03-23 VITALS — BP 152/82 | HR 79 | Temp 98.4°F | Resp 22 | Ht 65.0 in | Wt 109.0 lb

## 2017-03-23 DIAGNOSIS — I739 Peripheral vascular disease, unspecified: Secondary | ICD-10-CM | POA: Diagnosis not present

## 2017-03-23 DIAGNOSIS — I714 Abdominal aortic aneurysm, without rupture, unspecified: Secondary | ICD-10-CM

## 2017-03-23 NOTE — Progress Notes (Signed)
Patient name: Derek Jordan MRN: [id number] DOB: 05-12-1933 Sex: male   REASON FOR CONSULT:    Abdominal aortic aneurysm. The consult is requested by Dr. Irish Lack.   HPI:   Derek Jordan is a pleasant 83 y.o. male,  Who is been followed with an abdominal aortic aneurysm for many years. On his most recent ultrasound this had enlarged to 4.9 cm. For this reason he was sent for vascular consultation. He denies any abdominal pain or back pain.  He does have a history of peripheral vascular disease, but on my history, he denies any history of claudication, rest pain, or nonhealing ulcers.  He is status post previous left lobectomy for lung cancer.  He had a previous myocardial infarction but his had no recent cardiac symptoms.  I have reviewed the records that were sent from the referring office. The patient was seen on 08/30/2016. He has a history of coronary artery disease and a previous myocardial infarction. He also has a history of lung cancer and is undergone previous resection, chemotherapy, and radiation therapy. The patient also has a known 60% left renal artery stenosis by ultrasound. The patient had a previous RCA stent placed in 2005. He has been maintained on aspirin and has been off Plavix for several years. Hypertension at that time had worsened slightly. Medications were adjusted. Patient was continuing to smoke at that time.  Past Medical History:  Diagnosis Date  . AAA (abdominal aortic aneurysm) ([place])    a. Abd [place] 6/16:  3.9 x 4.4 cm with some intraluminal thrombus, chronic L EIA occlusion >> FU 6 mos  . Adenocarcinoma ([place])    Lung CA s/p resection, chemoTx, XR Tx  . CAD (coronary artery disease)    a. LHC 11/05 at time of MI:  pLAD 40, MOM 40, mRCA 99, EF 60% >> 3.5 x 28 mm Cypher DES to mRCA;  b. Myoview 6/16:  normal perfusion, EF 57%  . Carotid artery stenosis    a. Carotid US 5/12:  bilat 0-39%;  b. Carotid US 12/16:  Bilateral ICA 1-39%  . COPD (chronic  obstructive pulmonary disease) (HCC)    Dr. Melvyn Novas  . History of cardiac monitoring    Event Monitor 6/16:  NSR, PVCs, no A. fib noted, no significant pauses  . History of echocardiogram    Echo 6/16: Mild LVH, EF 55-60%, normal wall motion, grade 1 diastolic dysfunction, trivial TR, PASP 39 mmHg  . HTN (hypertension)   . Hx of radiation therapy   . Hyperlipidemia   . Mediastinal lymphadenopathy   . Myocardial infarct [facility]) 06/2004   s/p Cypher DES to RCA  . PVD (peripheral vascular disease) ([place])   . Renal artery stenosis (HCC)    a. hx of L RAS > 60%  . Thyroid nodule   . Tobacco abuse     Family History  Problem Relation Age of Onset  . Heart attack Unknown   . Diabetes Mother   . Heart failure Father   . Hypertension Father   . Heart attack Father   . Stroke Sister   . Lung cancer Brother        smoked    SOCIAL HISTORY: he smokes a pack per day of cigarettes and has been smoking for 75 years. Social History   Social History  . Marital status: Widowed    Spouse name: N/A  . Number of children: N/A  . Years of education: N/A   Occupational History  .  Not on file.   Social History Main Topics  . Smoking status: Current Every Day Smoker    Packs/day: 1.00    Years: 75.00    Types: Cigarettes  . Smokeless tobacco: Never Used  . Alcohol use No  . Drug use: No  . Sexual activity: Not on file   Other Topics Concern  . Not on file   Social History Narrative  . No narrative on file    No Known Allergies  Current Outpatient Prescriptions  Medication Sig Dispense Refill  . aspirin 81 MG tablet Take 81 mg by mouth daily.      [name] atorvastatin (LIPITOR) 40 MG tablet Take 1 tablet (40 mg total) by mouth daily. 90 tablet 3  . bisoprolol (ZEBETA) 5 MG tablet Take 1 tablet (5 mg total) by mouth daily. 30 tablet 11  . cetirizine (ZYRTEC) 10 MG tablet Take 10 mg by mouth daily.      . Multiple Vitamin (ONE-A-DAY MENS PO) Take by mouth daily.    [name] olmesartan (BENICAR)  40 MG tablet TAKE 1/2 TABLET BY MOUTH ONCE A DAY 15 tablet 3  . amLODipine (NORVASC) 5 MG tablet Take 1 tablet (5 mg total) by mouth daily. 180 tablet 2   No current facility-administered medications for this visit.     REVIEW OF SYSTEMS:  [X]  denotes positive finding, [ ]  denotes negative finding Cardiac  Comments:  Chest pain or chest pressure:    Shortness of breath upon exertion: X   Short of breath when lying flat:    Irregular heart rhythm:        Vascular    Pain in calf, thigh, or hip brought on by ambulation:    Pain in feet at night that wakes you up from your sleep:     Blood clot in your veins:    Leg swelling:         Pulmonary    Oxygen at home:    Productive cough:  X   Wheezing:  X       Neurologic    Sudden weakness in arms or legs:     Sudden numbness in arms or legs:     Sudden onset of difficulty speaking or slurred speech:    Temporary loss of vision in one eye:     Problems with dizziness:         Gastrointestinal    Blood in stool:     Vomited blood:         Genitourinary    Burning when urinating:     Blood in urine:        Psychiatric    Major depression:         Hematologic    Bleeding problems:    Problems with blood clotting too easily:        Skin    Rashes or ulcers:        Constitutional    Fever or chills:     PHYSICAL EXAM:   Vitals:   03/23/17 1532  BP: (!) 152/82  Pulse: 79  Resp: (!) 22  Temp: 98.4 F (36.9 C)  TempSrc: Oral  SpO2: (!) 89%  Weight: 109 lb (49.4 kg)  Height: 5\' 5"  (1.651 m)    GENERAL: The patient is a well-nourished male, in no acute distress. The vital signs are documented above. CARDIAC: There is a regular rate and rhythm.  VASCULAR: I do not detect carotid bruits. On the right side he  has a palpable femoral pulse and dorsalis pedis pulse. I cannot palpate a posterior tibial pulse. On the left side he has a palpable femoral pulse. I cannot palpate a popliteal, dorsalis pedis, or posterior  tibial pulse. He does have monophasic Doppler signals in the left foot which are reasonable. PULMONARY: There is good air exchange bilaterally without wheezing or rales.he does have decreased breath sounds at the left base resulted previous lobectomy. ABDOMEN: Soft and non-tender with normal pitched bowel sounds. His aneurysm is palpable and nontender. MUSCULOSKELETAL: There are no major deformities or cyanosis. NEUROLOGIC: No focal weakness or paresthesias are detected. SKIN: There are no ulcers or rashes noted. PSYCHIATRIC: The patient has a normal affect.  DATA:    DUPLEX OF ABDOMINAL AORTA: I have reviewed the duplex of the abdominal aorta that was done at the [facility] office on 02/07/2017. This showed that the maximum diameter of the infrarenal aorta was 4.9 cm. The right common iliac artery measured 1.2 cm in maximum diameter. The left common iliac artery measured 0.9 cm in maximum diameter.  CAROTID DUPLEX: He did have a carotid duplex scan at the [facility] office in December 2016 which showed a less than 39% stenosis bilaterally.   MEDICAL ISSUES:   4.9 CM INFRARENAL ABDOMINAL AORTIC ANEURYSM: I explained that any normal risk patient we would consider elective repair of an aneurysm at 5.5 cm. I think he is at increased risk because of his age and heavy smoking history. He gets routine 6 month follow up studies at Dr. Hassell Done office and would like to continue his studies there. If the aneurysm enlarges to 5.5 cm or greater then we should see him back to consider elective repair. He has no femoral pulse on the left and thus he may not be a candidate for an endovascular approach if he has significant iliac disease. If requires open repair, certainly given his age he would be at increased risk. We have had a long discussion about the importance of tobacco cessation but he seems to have no intention of quitting. He understands that continued tobacco use increases his risk of aneurysm  expansion and rupture.  PERIPHERAL VASCULAR DISEASE: he has evidence of iliac artery occlusive disease on the left but is asymptomatic. I have encouraged him to stay as active as possible. We have again discussed the importance of tobacco cessation. I would be happy to see him back at any time if he develops significant left lower extremity symptoms.  MILD BILATERAL CAROTID DISEASE: The patient has mild bilateral carotid disease which is followed in the [place] office.  Deitra Mayo [facility] [phone]

## 2017-08-30 ENCOUNTER — Ambulatory Visit: Payer: Medicare Other | Admitting: Interventional Cardiology

## 2017-08-31 ENCOUNTER — Other Ambulatory Visit: Payer: Self-pay | Admitting: Interventional Cardiology

## 2017-08-31 DIAGNOSIS — I714 Abdominal aortic aneurysm, without rupture, unspecified: Secondary | ICD-10-CM

## 2017-09-13 NOTE — Progress Notes (Signed)
Cardiology Office Note   Date:  09/14/2017   ID:  Derek Jordan 25-Apr-1933, MRN [id number]  PCP:  Cyndi Bender, PA-C    No chief complaint on file. CAD   Wt Readings from Last 3 Encounters:  09/14/17 107 lb 6.4 oz (48.7 kg)  03/23/17 109 lb (49.4 kg)  08/30/16 111 lb (50.3 kg)       History of Present Illness: Derek Jordan is a 84 y.o. male  with a history of single-vessel coronary artery disease, status post acute inferior wall myocardial infarction treated with Cypher drug-eluting stent to the mid right coronary artery in 2005, 3.5 x 28 mm. His LV function was normal. There was mild noncritical disease elsewhere. He also has a history of lung CA (s/p resection/chemo/XRT), hyperlipidemia, severe peripheral vascular disease with a small infrarenal abdominal aneurysm, carotid stenosis and greater than 60% left renal artery stenosis by ultrasound.  His sx was syncope with the MI.  He was moving a load of hay.  No chest pain, pressure or tightness.   He attributes that to his lung disease and surgery.  He still smokes.  He smokes 1-1.5 packs per day.  His breathing limits him.  He has a known AAA which is up to 4.5 cm.  Denies : Chest pain. Dizziness. Leg edema. Nitroglycerin use. Orthopnea. Palpitations. Paroxysmal nocturnal dyspnea.  Syncope.   He has intermittent Shortness of breath.  He has had home O2 in the past.  He had low O2 sats in 2016 with Dr. Melvyn Novas but has not had oxygen at home per his report.   Past Medical History:  Diagnosis Date  . AAA (abdominal aortic aneurysm) ([place])    a. Abd [place] 6/16:  3.9 x 4.4 cm with some intraluminal thrombus, chronic L EIA occlusion >> FU 6 mos  . Adenocarcinoma ([place])    Lung CA s/p resection, chemoTx, XR Tx  . CAD (coronary artery disease)    a. LHC 11/05 at time of MI:  pLAD 40, MOM 40, mRCA 99, EF 60% >> 3.5 x 28 mm Cypher DES to mRCA;  b. Myoview 6/16:  normal perfusion, EF 57%  . Carotid artery stenosis    a. Carotid  US 5/12:  bilat 0-39%;  b. Carotid US 12/16:  Bilateral ICA 1-39%  . COPD (chronic obstructive pulmonary disease) (HCC)    Dr. Melvyn Novas  . History of cardiac monitoring    Event Monitor 6/16:  NSR, PVCs, no A. fib noted, no significant pauses  . History of echocardiogram    Echo 6/16: Mild LVH, EF 55-60%, normal wall motion, grade 1 diastolic dysfunction, trivial TR, PASP 39 mmHg  . HTN (hypertension)   . Hx of radiation therapy   . Hyperlipidemia   . Mediastinal lymphadenopathy   . Myocardial infarct [facility]) 06/2004   s/p Cypher DES to RCA  . PVD (peripheral vascular disease) ([place])   . Renal artery stenosis (HCC)    a. hx of L RAS > 60%  . Thyroid nodule   . Tobacco abuse     Past Surgical History:  Procedure Laterality Date  . BIOPSY THYROID  03-13-2007  . CORONARY ARTERY DISEASE, S/P PTCA (06-09-2004)    . THORACOSCOPIC SURGICAL,WEDGE RESECTION (05-01-2007)    . TONSILLECTOMY       Current Outpatient Medications  Medication Sig Dispense Refill  . aspirin 81 MG tablet Take 81 mg by mouth daily.      [name] atorvastatin (LIPITOR) 40 MG tablet  Take 1 tablet (40 mg total) by mouth daily. 90 tablet 3  . bisoprolol (ZEBETA) 5 MG tablet Take 1 tablet (5 mg total) by mouth daily. 30 tablet 11  . cetirizine (ZYRTEC) 10 MG tablet Take 10 mg by mouth daily.      . Multiple Vitamin (ONE-A-DAY MENS PO) Take by mouth daily.    [name] olmesartan (BENICAR) 40 MG tablet TAKE 1/2 TABLET BY MOUTH ONCE A DAY 15 tablet 3  . amLODipine (NORVASC) 5 MG tablet Take 1 tablet (5 mg total) by mouth daily. 180 tablet 2   No current facility-administered medications for this visit.     Allergies:   Patient has no known allergies.    Social History:  The patient  reports that he has been smoking cigarettes.  He has a 75.00 pack-year smoking history. he has never used smokeless tobacco. He reports that he does not drink alcohol or use drugs.   Family History:  The patient's family history includes Diabetes in his  mother; Heart attack in his father and unknown relative; Heart failure in his father; Hypertension in his father; Lung cancer in his brother; Stroke in his sister.    ROS:  Please see the history of present illness.   Otherwise, review of systems are positive for occasional DOE..   All other systems are reviewed and negative.    PHYSICAL EXAM: VS:  BP 102/62   Pulse 80   Ht 5\' 5"  (1.651 m)   Wt 107 lb 6.4 oz (48.7 kg)   SpO2 (!) 83%   BMI 17.87 kg/m  , BMI Body mass index is 17.87 kg/m. GEN: Well nourished, well developed, in no acute distress  HEENT: normal  Neck: no JVD, carotid bruits, or masses Cardiac: RRR; no murmurs, rubs, or gallops,no edema  Respiratory:  clear to auscultation bilaterally, normal work of breathing GI: soft, nontender, nondistended, + BS MS: no deformity or atrophy  Skin: warm and dry, no rash Neuro:  Strength and sensation are intact Psych: euthymic mood, full affect   EKG:   The ekg ordered today demonstrates NSR, septal infarct, inferior ST depressions- no significant changes   Recent Labs: No results found for requested labs within last 8760 hours.   Lipid Panel    Component Value Date/Time   CHOL 111 12/22/2014 1242   TRIG 41.0 12/22/2014 1242   HDL 48.40 12/22/2014 1242   CHOLHDL 2 12/22/2014 1242   VLDL 8.2 12/22/2014 1242   LDLCALC 54 12/22/2014 1242     Other studies Reviewed: Additional studies/ records that were reviewed today with results demonstrating: prior documentation of hypoxemia in 2016 with Dr. Melvyn Novas..   ASSESSMENT AND PLAN:  1. CAD/Inferior MI: s/p RCA stent as above.  Continue aggressive secondary prevention. 2. HTN: BP controlled.  Continue current medications. 3. Tobacco abuse: He thinks he smokes more now.  Not trying to quit.  4. AAA: 4.5 cm .  Was rechecked today but result is not back.  We will contact him with results. 5. COPD: Resting oxygen saturation 83%.  On 2 L, oxygen saturation 93%.  I offered to send  him to the hospital for further workup.  He feels at his baseline and declined.  He is interested in considering oxygen therapy at home which she has had in the past.  I asked him to follow-up with his primary care physician.  We contacted Ovid Curd [facility]'s office to inform them of the oxygen saturation issues.  He will follow-up with Ovid Curd  Tobie Lords.  Appt was offered for tomorrow but he preferred to go next week.    Current medicines are reviewed at length with the patient today.  The patient concerns regarding his medicines were addressed.  The following changes have been made:  No change  Labs/ tests ordered today include:  No orders of the defined types were placed in this encounter.   Recommend 150 minutes/week of aerobic exercise Low fat, low carb, high fiber diet recommended  Disposition:   FU in 1 year   Signed, Larae Grooms, MD  09/14/2017 11:20 AM    [facility] [address], [place], [place]  [address] Phone: [phone]; Fax: [phone]

## 2017-09-14 ENCOUNTER — Ambulatory Visit (HOSPITAL_COMMUNITY)
Admission: RE | Admit: 2017-09-14 | Discharge: 2017-09-14 | Disposition: A | Discharge status: Home / Self Care | Payer: Medicare Other | Source: Ambulatory Visit | Attending: Cardiology | Admitting: Cardiology

## 2017-09-14 ENCOUNTER — Ambulatory Visit (INDEPENDENT_AMBULATORY_CARE_PROVIDER_SITE_OTHER): Payer: Medicare Other | Admitting: Interventional Cardiology

## 2017-09-14 ENCOUNTER — Encounter: Payer: Self-pay | Admitting: Interventional Cardiology

## 2017-09-14 VITALS — BP 102/62 | HR 80 | Ht 65.0 in | Wt 107.4 lb

## 2017-09-14 DIAGNOSIS — E785 Hyperlipidemia, unspecified: Secondary | ICD-10-CM | POA: Diagnosis not present

## 2017-09-14 DIAGNOSIS — I10 Essential (primary) hypertension: Secondary | ICD-10-CM

## 2017-09-14 DIAGNOSIS — F17200 Nicotine dependence, unspecified, uncomplicated: Secondary | ICD-10-CM | POA: Insufficient documentation

## 2017-09-14 DIAGNOSIS — Z720 Tobacco use: Secondary | ICD-10-CM | POA: Diagnosis not present

## 2017-09-14 DIAGNOSIS — I252 Old myocardial infarction: Secondary | ICD-10-CM | POA: Diagnosis not present

## 2017-09-14 DIAGNOSIS — I714 Abdominal aortic aneurysm, without rupture, unspecified: Secondary | ICD-10-CM

## 2017-09-14 DIAGNOSIS — I2510 Atherosclerotic heart disease of native coronary artery without angina pectoris: Secondary | ICD-10-CM | POA: Diagnosis not present

## 2017-09-14 DIAGNOSIS — I745 Embolism and thrombosis of iliac artery: Secondary | ICD-10-CM | POA: Insufficient documentation

## 2017-09-14 DIAGNOSIS — J449 Chronic obstructive pulmonary disease, unspecified: Secondary | ICD-10-CM

## 2017-09-14 NOTE — Patient Instructions (Addendum)
Medication Instructions:  Your physician recommends that you continue on your current medications as directed. Please refer to the Current Medication list given to you today.   Labwork: None ordered  Testing/Procedures: None ordered  Follow-Up:  Your physician recommends that you follow-up with your Primary Care Provider Cyndi Bender, PA on 09/18/17 at 10:30 AM  Your physician wants you to follow-up in: 1 year with Dr. Irish Lack. You will receive a reminder letter in the mail two months in advance. If you don't receive a letter, please call our office to schedule the follow-up appointment.   Any Other Special Instructions Will Be Listed Below (If Applicable).     If you need a refill on your cardiac medications before your next appointment, please call your pharmacy.

## 2017-09-21 ENCOUNTER — Telehealth: Payer: Self-pay | Admitting: Internal Medicine

## 2017-09-21 ENCOUNTER — Telehealth: Payer: Self-pay | Admitting: Interventional Cardiology

## 2017-09-21 NOTE — Telephone Encounter (Signed)
New Message   Patient and daughter Derek Jordan was calling today in reference to the oxygen therapy at home. They indicate they are having a hard time getting it approved with insurance and would like some assistance. She is saying that the insurance is wanting them to do an inhaler but they have tried that before. Therefore they need an authorization and documentation to indicated that the inhaler did not work. Please call to discuss.

## 2017-09-21 NOTE — Telephone Encounter (Signed)
Patient's daughter calling and states that they are having a hard time getting his home oxygen approved with the insurance and would like some assistance. She states that she would like for [place] to provide documentation that he has tried an inhaler in the past and that it did not work. Made daughter aware that she needs to reach out to Cyndi Bender, [place], the patient's PCP, who has ordered the home oxygen, or to Dr. Melvyn Novas the patient's pulmonologist. Made her aware that we could provide her with documentation stating that at his recent visit on 09/14/17 his O2 on RA was 83%, on 1 L 87%, and on 2 L 93%. Daughter states that she will reach out to Dr. Melvyn Novas.

## 2017-09-21 NOTE — Telephone Encounter (Signed)
Called and spoke with pt's daughter, Levada Dy.  Levada Dy states that pt's cardiologist prescribed O2. Levada Dy states that insurance did deny O2, due to not trying alternative medications. Pt states cardiology nor PCP has mentioned starting inhalers.  Pt has been scheduled with MW on 09/25/17 at [place] is aware of apt and voiced her understanding. Nothing further is needed.

## 2017-09-25 ENCOUNTER — Other Ambulatory Visit (INDEPENDENT_AMBULATORY_CARE_PROVIDER_SITE_OTHER): Payer: Medicare Other

## 2017-09-25 ENCOUNTER — Ambulatory Visit (INDEPENDENT_AMBULATORY_CARE_PROVIDER_SITE_OTHER): Payer: Medicare Other | Admitting: Internal Medicine

## 2017-09-25 ENCOUNTER — Encounter: Payer: Self-pay | Admitting: Internal Medicine

## 2017-09-25 ENCOUNTER — Ambulatory Visit (INDEPENDENT_AMBULATORY_CARE_PROVIDER_SITE_OTHER)
Admission: RE | Admit: 2017-09-25 | Discharge: 2017-09-25 | Disposition: A | Discharge status: Home / Self Care | Payer: Medicare Other | Source: Ambulatory Visit | Attending: Internal Medicine | Admitting: Internal Medicine

## 2017-09-25 VITALS — BP 128/70 | Ht 65.0 in | Wt 108.0 lb

## 2017-09-25 DIAGNOSIS — R0609 Other forms of dyspnea: Secondary | ICD-10-CM

## 2017-09-25 DIAGNOSIS — F17210 Nicotine dependence, cigarettes, uncomplicated: Secondary | ICD-10-CM | POA: Diagnosis not present

## 2017-09-25 DIAGNOSIS — J449 Chronic obstructive pulmonary disease, unspecified: Secondary | ICD-10-CM | POA: Diagnosis not present

## 2017-09-25 DIAGNOSIS — J9611 Chronic respiratory failure with hypoxia: Secondary | ICD-10-CM

## 2017-09-25 LAB — CBC WITH DIFFERENTIAL/PLATELET
Basophils Absolute: 0.1 10*3/uL (ref 0.0–0.1)
Basophils Relative: 1 % (ref 0.0–3.0)
EOS PCT: 1.6 % (ref 0.0–5.0)
Eosinophils Absolute: 0.1 10*3/uL (ref 0.0–0.7)
HCT: 47.5 % (ref 39.0–52.0)
HEMOGLOBIN: 16.1 g/dL (ref 13.0–17.0)
Lymphocytes Relative: 26.6 % (ref 12.0–46.0)
Lymphs Abs: 2.2 10*3/uL (ref 0.7–4.0)
MCHC: 34 g/dL (ref 30.0–36.0)
MCV: 94 fl (ref 78.0–100.0)
MONO ABS: 0.6 10*3/uL (ref 0.1–1.0)
Monocytes Relative: 7.4 % (ref 3.0–12.0)
Neutro Abs: 5.3 10*3/uL (ref 1.4–7.7)
Neutrophils Relative %: 63.4 % (ref 43.0–77.0)
PLATELETS: 261 10*3/uL (ref 150.0–400.0)
RBC: 5.05 Mil/uL (ref 4.22–5.81)
RDW: 15.3 % (ref 11.5–15.5)
WBC: 8.3 10*3/uL (ref 4.0–10.5)

## 2017-09-25 LAB — BASIC METABOLIC PANEL
BUN: 23 mg/dL (ref 6–23)
CHLORIDE: 96 meq/L (ref 96–112)
CO2: 27 meq/L (ref 19–32)
CREATININE: 1.2 mg/dL (ref 0.40–1.50)
Calcium: 10.3 mg/dL (ref 8.4–10.5)
GFR: 61.49 mL/min (ref >60.00)
GLUCOSE: 84 mg/dL (ref 70–99)
Potassium: 5 mEq/L (ref 3.5–5.1)
Sodium: 138 mEq/L (ref 135–145)

## 2017-09-25 LAB — TSH: TSH: 1.14 u[IU]/mL (ref 0.35–4.50)

## 2017-09-25 LAB — BRAIN NATRIURETIC PEPTIDE: Pro B Natriuretic peptide (BNP): 139 pg/mL — ABNORMAL HIGH (ref 0.0–100.0)

## 2017-09-25 MED ORDER — FLUTICASONE-UMECLIDIN-VILANT 100-62.5-25 MCG/INH IN AEPB: 1.0000 | INHALATION_SPRAY | Freq: Every day | RESPIRATORY_TRACT | 0 refills | Status: DC

## 2017-09-25 NOTE — Assessment & Plan Note (Addendum)
-   trial off acei 01/23/2015 > improved  - PFTs 03/13/2015  FEV1  0.63 (28%) ratio 44 p 13% better from saba and dlco 24 corrects to 40 for alv vol - 03/14/2015 trial off coreg and on bisoprolol > improved    - 09/25/2017  After extensive coaching inhaler device  effectiveness =    90% with dpi/ elipta so rec trial of trelegy and f/u pfts in 4 weeks

## 2017-09-25 NOTE — Progress Notes (Signed)
Subjective:    Patient ID: Derek Jordan, male    DOB: 1933-01-02,     MRN: [id number]    Brief patient profile:  66 yowm active smoker widower lives in [place] with no primary care doctor referred by to pulmonary eval 01/22/2015 [name] for copd eval and proved to have GOLD IV severity 03/13/15 with symptoms than improved off acei and coreg     History of Present Illness  01/22/2015 1st [facility] office visit/ Derek Jordan   Chief Complaint  Patient presents with  . Pulmonary Consult    Referred by [name], PA for eval of COPD. Pt c/o cough and SOB- progressively worse over the past year. He is SOB sometimes just sitting and with minimal exertion. Cough is prod with large amounts of sputum "sometimes clear sometimes dark".   s/p L lower lowb partial lobectomy  2008 : Adenoca  pT2, pN2, pMX  s/p RT and chemo and no doe until around 2015 on acei with indolent onset  Progressively worse  sob > slow and flat ok but  Sometimes can't even walk at a nl pace x 10 ft but varies a lot s pattern/ sometimes sob at rest/ no better with inhalers in past / assoc with severe coughing fits more day than night. rec Prednisone 10 mg take  4 each am x 2 days,   2 each am x 2 days,  1 each am x 2 days and stop  Stop lisinopril Start benicar 20 one daily  The key is to stop smoking completely before smoking completely stops you!     03/13/2015 f/u ov/Derek Jordan re: copd GOLD IV no maint rx/ no saba  Chief Complaint  Patient presents with  . Follow-up    PFT done today. Pt states his breathing is much improved.     Not limited by breathing from desired activities  But very inactive >change coreg to bisoprolol    07/17/2015  f/u ov/Derek Jordan re: COPD IV / no pulmonary rx  Chief Complaint  Patient presents with  . Follow-up    Pt states that his breathing is unchanged. He has had prod cough with min yellow sputum.   cough tends to be in am's but really not bothering him enough to want to cut down / quit  smoking/ worse also in winter typical of CB  rec The key is to stop smoking completely before smoking completely stops you!  See if you can establish with [facility] for primary care Pulmonary follow up is as needed if breathing or cough worsen     09/25/2017   extended ov/Derek Jordan re: re- establish re GOLD IV copd / now hypoxemic  Chief Complaint  Patient presents with  . Follow-up    went to see cards on 09/14/17- sats low and o2 was ordered but insurance will not cover due to "not trying other methods for COPD".  He states has good days and bad days with breathing.    gradually downhill x 4 months esp noct sob  MMRC2 = can't walk a nl pace on a flat grade s sob but does fine slow and flat eg [employer] s 02  Sleeps horizontal with sob when lies down x up to 2h  eventually able to sleep x 4 m with minimal am rattling > minimal mucoid sputum   No obvious day to day or daytime variability or assoc   purulent sputum or mucus plugs or hemoptysis or cp or chest tightness,  subjective wheeze or overt sinus or hb symptoms. No unusual exposure hx or h/o childhood pna/ asthma or knowledge of premature birth.  Sleeping ok flat without nocturnal  or early am exacerbation  of respiratory  c/o's or need for noct saba. Also denies any obvious fluctuation of symptoms with weather or environmental changes or other aggravating or alleviating factors except as outlined above   Current Allergies, Complete Past Medical History, Past Surgical History, Family History, and Social History were reviewed in [employer] record.  ROS  The following are not active complaints unless bolded Hoarseness, sore throat, dysphagia, dental problems, itching, sneezing,  nasal congestion or discharge of excess mucus or purulent secretions, ear ache,   fever, chills, sweats, unintended wt loss or wt gain, classically pleuritic or exertional cp,  orthopnea pnd or leg swelling, presyncope, palpitations,  abdominal pain, anorexia, nausea, vomiting, diarrhea  or change in bowel habits or change in bladder habits, change in stools or change in urine, dysuria, hematuria,  rash, arthralgias, visual complaints, headache, numbness, weakness or ataxia or problems with walking or coordination,  change in mood/affect or memory.        Current Meds  Medication Sig  . amLODipine (NORVASC) 5 MG tablet Take 1 tablet (5 mg total) by mouth daily.  [name] aspirin 81 MG tablet Take 81 mg by mouth daily.    [name] atorvastatin (LIPITOR) 40 MG tablet Take 1 tablet (40 mg total) by mouth daily.  . bisoprolol (ZEBETA) 5 MG tablet Take 1 tablet (5 mg total) by mouth daily.  . cetirizine (ZYRTEC) 10 MG tablet Take 10 mg by mouth daily.    . Multiple Vitamin (ONE-A-DAY MENS PO) Take by mouth daily.  [name] olmesartan (BENICAR) 40 MG tablet TAKE 1/2 TABLET BY MOUTH ONCE A DAY                       Objective:   Physical Exam  amb wm nad / rattling cough   03/13/2015       108 >112 04/03/2015 > 07/17/2015 115 > 09/25/2017  108      Vital signs reviewed - Note on arrival 02 sats  Were 83% at rest RA and 91% on 2lpm        HEENT: nl   turbinates bilaterally, and oropharynx. Nl external ear canals without cough reflex - edentulous    NECK :  without JVD/Nodes/TM/ nl carotid upstrokes bilaterally   LUNGS: no acc muscle use,  Barrel chest with distant bs with faint late exp wheeze bilaterally and hyper resonance to percussion   CV:  RRR  no s3 or murmur or increase in P2, and no edema   ABD:  soft and nontender with  Pos Hoover's mid insp in  supine position. No bruits or organomegaly appreciated, bowel sounds nl  MS:  Nl gait/ ext warm without deformities, calf tenderness, cyanosis or clubbing No obvious joint restrictions   SKIN: warm and dry without lesions    NEURO:  alert, approp, nl sensorium with  no motor or cerebellar deficits apparent.     CXR PA and Lateral:   09/25/2017 :    I personally reviewed  images and agree with radiology impression as follows:   Postoperative change on the left. Lungs hyperexpanded with areas of scattered scarring. No frank edema or consolidation. No adenopathy. There is aortic atherosclerosis.   Labs ordered/ reviewed:      Chemistry      Component  Value Date/Time   NA 138 09/25/2017 0953   NA 137 12/04/2009 1312   K 5.0 09/25/2017 0953   K 4.3 12/04/2009 1312   CL 96 09/25/2017 0953   CL 97 (L) 12/04/2009 1312   CO2 27 09/25/2017 0953   CO2 29 12/04/2009 1312   BUN 23 09/25/2017 0953   BUN 16 12/04/2009 1312   CREATININE 1.20 09/25/2017 0953   CREATININE 1.1 12/04/2009 1312      Component Value Date/Time   CALCIUM 10.3 09/25/2017 0953                                                      Lab Results  Component Value Date   WBC 8.3 09/25/2017   HGB 16.1 09/25/2017   HCT 47.5 09/25/2017   MCV 94.0 09/25/2017   PLT 261.0 09/25/2017       EOS                       0.1                                                                   09/25/2017     Lab Results  Component Value Date   TSH 1.14 09/25/2017     Lab Results  Component Value Date   PROBNP 139.0 (H) 09/25/2017           Assessment & Plan:

## 2017-09-25 NOTE — Assessment & Plan Note (Signed)
sats 83% at rest 09/25/2017 rec 2lpm 24/7   He  Clearly has longstanding hypoxemia given desats at rest and still able to do [employer]   Warned re 02 and use of cigs

## 2017-09-25 NOTE — Patient Instructions (Signed)
Start Trelegy one click each am   Start 2lpm 24/7 and no smoking when 02 is running  Please remember to go to the lab and x-ray department downstairs in the basement  for your tests - we will call you with the results when they are available.     Please schedule a follow up office visit in 4 weeks, sooner if needed with pfts

## 2017-09-26 ENCOUNTER — Encounter: Payer: Self-pay | Admitting: Internal Medicine

## 2017-09-26 NOTE — Assessment & Plan Note (Signed)
DDX of  difficult airways management almost all start with A and  include Adherence, Ace Inhibitors, Acid Reflux, Active Sinus Disease, Alpha 1 Antitripsin deficiency, Anxiety masquerading as Airways dz,  ABPA,  Allergy(esp in young), Aspiration (esp in elderly), Adverse effects of meds,  Active smokers, A bunch of PE's (a small clot burden can't cause this syndrome unless there is already severe underlying pulm or vascular dz with poor reserve)., Anemia or thyroid dz/ plus two Bs  = Bronchiectasis and Beta blocker use..and one C= CHF    Adherence is always the initial "prime suspect" and is a multilayered concern that requires a "trust but verify" approach in every patient - starting with knowing how to use medications, especially inhalers, correctly, keeping up with refills and understanding the fundamental difference between maintenance and prns vs those medications only taken for a very short course and then stopped and not refilled.  - see dpi training - return with all meds in hand using a trust but verify approach to confirm accurate Medication  Reconciliation The principal here is that until we are certain that the  patients are doing what we've asked, it makes no sense to ask them to do more.   Active smoking top of the list of usual suspects (see separate a/p)   ? Allergy/asthma component > Eos low but will empirically try   ics as pt may be =  Group D in terms of symptom/risk and laba/lama/ICS  therefore appropriate rx at this point.  ? Anemia/ thyroid dz > excluded  ? CHF > unlikely with bnp so low   Return with full pfts in 4 weeks    I had an extended discussion with the patient reviewing all relevant studies completed to date and  lasting 25 minutes of a 40  minute office  visit to re establish with me     re  severe non-specific but potentially very serious refractory respiratory symptoms of uncertain and potentially multiple  etiologies.   Each maintenance medication was  reviewed in detail including most importantly the difference between maintenance and prns and under what circumstances the prns are to be triggered using an action plan format that is not reflected in the computer generated alphabetically organized AVS.    Please see AVS for specific instructions unique to this office visit that I personally wrote and verbalized to the the pt in detail and then reviewed with pt  by my nurse highlighting any changes in therapy/plan of care  recommended at today's visit.

## 2017-09-26 NOTE — Progress Notes (Signed)
Spoke with pt and notified of results per Dr. Wert. Pt verbalized understanding and denied any questions. 

## 2017-09-26 NOTE — Assessment & Plan Note (Signed)
>   3 min Discussed the risks and costs (both direct and indirect)  of smoking relative to the benefits of quitting but patient unwilling to commit at this point to a specific quit date.       

## 2017-09-27 LAB — RESPIRATORY ALLERGY PROFILE REGION II ~~LOC~~
Allergen, A. alternata, m6: <0.1 kU/L
Allergen, Cedar tree, t12: <0.1 kU/L
Allergen, Mouse Urine Protein, e78: <0.1 kU/L
Allergen, Mulberry, t76: <0.1 kU/L
Allergen, Oak,t7: <0.1 kU/L
Allergen, P. notatum, m1: <0.1 kU/L
CLASS: 0
CLASS: 0
CLASS: 0
CLASS: 0
CLASS: 0
CLASS: 0
CLASS: 0
CLASS: 0
CLASS: 0
CLASS: 0
CLASS: 0
Cat Dander: <0.1 kU/L
Class: 0
Class: 0
Class: 0
Class: 0
Class: 0
Class: 0
Class: 0
Class: 0
Class: 0
Class: 0
Class: 0
Class: 0
Class: 0
Cockroach: <0.1 kU/L
D. farinae: <0.1 kU/L
IgE (Immunoglobulin E), Serum: 102 kU/L (ref <=114)
Pecan/Hickory Tree IgE: <0.1 kU/L
Timothy Grass: <0.1 kU/L

## 2017-09-27 LAB — INTERPRETATION:

## 2017-10-16 ENCOUNTER — Telehealth: Payer: Self-pay | Admitting: Internal Medicine

## 2017-10-16 NOTE — Telephone Encounter (Signed)
Patients daughter aware sample is placed up front. Nothing further needed.

## 2017-10-26 ENCOUNTER — Ambulatory Visit (INDEPENDENT_AMBULATORY_CARE_PROVIDER_SITE_OTHER): Payer: Medicare Other | Admitting: Internal Medicine

## 2017-10-26 ENCOUNTER — Telehealth: Payer: Self-pay | Admitting: Internal Medicine

## 2017-10-26 ENCOUNTER — Encounter: Payer: Self-pay | Admitting: Internal Medicine

## 2017-10-26 VITALS — BP 122/70 | HR 89 | Ht 65.0 in | Wt 113.0 lb

## 2017-10-26 DIAGNOSIS — J449 Chronic obstructive pulmonary disease, unspecified: Secondary | ICD-10-CM

## 2017-10-26 DIAGNOSIS — J9611 Chronic respiratory failure with hypoxia: Secondary | ICD-10-CM

## 2017-10-26 DIAGNOSIS — F17210 Nicotine dependence, cigarettes, uncomplicated: Secondary | ICD-10-CM

## 2017-10-26 LAB — PULMONARY FUNCTION TEST
DL/VA % PRED: 51 %
DL/VA: 2.17 ml/min/mmHg/L
DLCO COR % PRED: 33 %
DLCO COR: 8.58 ml/min/mmHg
DLCO unc % pred: 34 %
DLCO unc: 8.92 ml/min/mmHg
FEF 25-75 POST: 0.54 L/s
FEF 25-75 Pre: 0.4 L/sec
FEF2575-%CHANGE-POST: 36 %
FEF2575-%PRED-POST: 38 %
FEF2575-%Pred-Pre: 28 %
FEV1-%CHANGE-POST: 14 %
FEV1-%Pred-Post: 56 %
FEV1-%Pred-Pre: 49 %
FEV1-POST: 1.21 L
FEV1-Pre: 1.05 L
FEV1FVC-%CHANGE-POST: 8 %
FEV1FVC-%PRED-PRE: 68 %
FEV6-%Change-Post: 7 %
FEV6-%Pred-Post: 79 %
FEV6-%Pred-Pre: 73 %
FEV6-Post: 2.25 L
FEV6-Pre: 2.09 L
FEV6FVC-%Change-Post: 1 %
FEV6FVC-%Pred-Post: 106 %
FEV6FVC-%Pred-Pre: 105 %
FVC-%Change-Post: 6 %
FVC-%PRED-PRE: 69 %
FVC-%Pred-Post: 74 %
FVC-POST: 2.29 L
FVC-PRE: 2.16 L
POST FEV1/FVC RATIO: 53 %
Post FEV6/FVC ratio: 98 %
Pre FEV1/FVC ratio: 49 %
Pre FEV6/FVC Ratio: 97 %
RV % pred: 170 %
RV: 4.16 L
TLC % PRED: 107 %
TLC: 6.5 L

## 2017-10-26 MED ORDER — FLUTICASONE-UMECLIDIN-VILANT 100-62.5-25 MCG/INH IN AEPB: 1.0000 | INHALATION_SPRAY | Freq: Every day | RESPIRATORY_TRACT | 11 refills | Status: DC

## 2017-10-26 NOTE — Assessment & Plan Note (Signed)
>   3 min discussion I reviewed the Fletcher curve with the patient that basically indicates  if you quit smoking when your best day FEV1 is still  preserved (as is still relatively true here)  it is highly unlikely you will progress to severe disease and informed the patient there was  no medication on the market that has proven to alter the curve/ its downward trajectory  or the likelihood of progression of their disease(unlike other chronic medical conditions such as atheroclerosis where we do think we can change the natural hx with risk reducing meds)    Therefore stopping smoking and maintaining abstinence are  the most important aspects of care, not choice of inhalers or for that matter, doctors.   Treatment other than smoking cessation  is entirely directed by severity of symptoms and focused also on reducing exacerbations, not attempting to change the natural history of the disease.

## 2017-10-26 NOTE — Telephone Encounter (Signed)
Attempted to call pt but no answer.   Left message for pt to return our call x1 

## 2017-10-26 NOTE — Assessment & Plan Note (Signed)
-   trial off acei 01/23/2015 > improved  - PFTs 03/13/2015  FEV1  0.63 (28%) ratio 44 p 13% better from saba and dlco 24 corrects to 40 for alv vol - 03/14/2015 trial off coreg and on bisoprolol > improved  - 09/25/2017  After extensive coaching inhaler device  effectiveness =    90% with dpi/ elipta so rec trial of trelegy and f/u pfts in 4 weeks  - Allergy profile 09/25/17  >  Eos 0.1 /  IgE  102 RAST neg - PFT's  10/26/2017  FEV1 1.21 (56 % ) ratio 53  p 14  % improvement from saba p trelegy prior to study with DLCO  34/33 % corrects to 51  % for alv volume    His fev1 has doubled on laba/lama/ics despite ongoing active smoking (see separate a/p)   Group D in terms of symptom/risk and laba/lama/ICS  therefore appropriate rx at this point.    Will continue trelegy for now  Discussed with pt :  Formulary restrictions will be an ongoing challenge for the forseable future and I would be happy to pick an alternative if the pt will first  provide me a list of them but pt  will need to return here for training for any new device that is required eg dpi vs hfa vs respimat.    In meantime we can always provide samples so the patient never runs out of any needed respiratory medications.

## 2017-10-26 NOTE — Telephone Encounter (Signed)
Called and spoke with Corene Cornea, he states that the order we sent in for the POC the patient does not want it. The patient states that POC is too big. Corene Cornea suggests that we could do the simply go mini. Corene Cornea states that if MW is ok with doing the simply go mini then we would need to bring him in to be tested on this and a new order placed.   MW please advise is this ok, thanks

## 2017-10-26 NOTE — Patient Instructions (Signed)
Please see patient coordinator before you leave today  to schedule ambulatory 02 titration to see if eligible for POC   No change medications  The key is to stop smoking completely before smoking completely stops you!   Please schedule a follow up visit in 3 months but call sooner if needed

## 2017-10-26 NOTE — Progress Notes (Addendum)
Subjective:    Patient ID: Derek Jordan, male    DOB: 1933-03-03,     MRN: [id number]    Brief patient profile:  89 yowm active smoker widower lives in [place] with no primary care doctor referred by to pulmonary eval 01/22/2015 Richardson Jordan for copd eval and proved to have GOLD IV severity 03/13/15 with symptoms than improved off acei and coreg     History of Present Illness  01/22/2015 1st [facility] office visit/ Derek Jordan   Chief Complaint  Patient presents with  . Pulmonary Consult    Referred by [name], PA for eval of COPD. Pt c/o cough and SOB- progressively worse over the past year. He is SOB sometimes just sitting and with minimal exertion. Cough is prod with large amounts of sputum "sometimes clear sometimes dark".   s/p L lower lowb partial lobectomy  2008 : Adenoca  pT2, pN2, pMX  s/p RT and chemo and no doe until around 2015 on acei with indolent onset  Progressively worse  sob > slow and flat ok but  Sometimes can't even walk at a nl pace x 10 ft but varies a lot s pattern/ sometimes sob at rest/ no better with inhalers in past / assoc with severe coughing fits more day than night. rec Prednisone 10 mg take  4 each am x 2 days,   2 each am x 2 days,  1 each am x 2 days and stop  Stop lisinopril Start benicar 20 one daily  The key is to stop smoking completely before smoking completely stops you!     03/13/2015 f/u ov/Derek Jordan re: copd GOLD IV no maint rx/ no saba  Chief Complaint  Patient presents with  . Follow-up    PFT done today. Pt states his breathing is much improved.     Not limited by breathing from desired activities  But very inactive >change coreg to bisoprolol    07/17/2015  f/u ov/Derek Jordan re: COPD IV / no pulmonary rx  Chief Complaint  Patient presents with  . Follow-up    Pt states that his breathing is unchanged. He has had prod cough with min yellow sputum.   cough tends to be in am's but really not bothering him enough to want to cut down / quit  smoking/ worse also in winter typical of CB  rec The key is to stop smoking completely before smoking completely stops you!  See if you can establish with [facility] for primary care Pulmonary follow up is as needed if breathing or cough worsen     09/25/2017   extended ov/Derek Jordan re: re- establish re GOLD IV copd / now hypoxemic  Chief Complaint  Patient presents with  . Follow-up    went to see cards on 09/14/17- sats low and o2 was ordered but insurance will not cover due to "not trying other methods for COPD".  He states has good days and bad days with breathing.    gradually downhill x 4 months esp noct sob  MMRC2 = can't walk a nl pace on a flat grade s sob but does fine slow and flat eg [employer] s 02  Sleeps horizontal with sob when lies down x up to 2h  eventually able to sleep x 4 m with minimal am rattling > minimal mucoid sputum  rec Start Trelegy one click each am  Start 2lpm 24/7 and no smoking when 02 is running Please remember to go to the lab  and x-ray department downstairs in the basement  for your tests - we will call you with the results when they are available.     10/26/2017  f/u ov/Derek Jordan re: COPD II criteria/ 02 2lpm hs not using daytime  Chief Complaint  Patient presents with  . Follow-up    PFT's done today. Breathing is better and he is coughing much less.    Dyspnea:  MMRC2 = can't walk a nl pace on a flat grade s sob but does fine slow and flat  Cough: am rattling  Sleep: ok 2lpm flat SABA use:  None  No obvious day to day or daytime variability or assoc excess/ purulent sputum or mucus plugs or hemoptysis or cp or chest tightness, subjective wheeze or overt sinus or hb symptoms.   Sleep 2lpm flat  without nocturnal  or early am exacerbation  of respiratory  c/o's or need for noct saba. Also denies any obvious fluctuation of symptoms with weather or environmental changes or other aggravating or alleviating factors except as outlined above   No  unusual exposure hx or h/o childhood pna/ asthma or knowledge of premature birth.  Current Allergies, Complete Past Medical History, Past Surgical History, Family History, and Social History were reviewed in [employer] record.  ROS  The following are not active complaints unless bolded Hoarseness, sore throat, dysphagia, dental problems, itching, sneezing,  nasal congestion or discharge of excess mucus or purulent secretions, ear ache,   fever, chills, sweats, unintended wt loss or wt gain, classically pleuritic or exertional cp,  orthopnea pnd or arm/hand swelling  or leg swelling, presyncope, palpitations, abdominal pain, anorexia, nausea, vomiting, diarrhea  or change in bowel habits or change in bladder habits, change in stools or change in urine, dysuria, hematuria,  rash, arthralgias, visual complaints, headache, numbness, weakness or ataxia or problems with walking or coordination,  change in mood or  memory.        Current Meds  Medication Sig  . aspirin 81 MG tablet Take 81 mg by mouth daily.    [name] atorvastatin (LIPITOR) 40 MG tablet Take 1 tablet (40 mg total) by mouth daily.  . bisoprolol (ZEBETA) 5 MG tablet Take 1 tablet (5 mg total) by mouth daily.  . cetirizine (ZYRTEC) 10 MG tablet Take 10 mg by mouth daily.    . Fluticasone-Umeclidin-Vilant (TRELEGY ELLIPTA) 100-62.5-25 MCG/INH AEPB Inhale 1 puff into the lungs daily.  . Multiple Vitamin (ONE-A-DAY MENS PO) Take by mouth daily.  [name] olmesartan (BENICAR) 40 MG tablet TAKE 1/2 TABLET BY MOUTH ONCE A DAY  . OXYGEN DME- [facility]  2lpm with sleep  .     [name]                           Objective:   Physical Exam   amb wm/ still some rattling  03/13/2015       108 >112 04/03/2015 > 07/17/2015 115 > 09/25/2017  108  > 10/26/2017  114    Vital signs reviewed - Note on arrival 02 sats  90% on RA           HEENT: nl  oropharynx. Nl external ear canals without cough reflex - moderate bilateral non-specific  turbinate edema  / edentulous    NECK :  without JVD/Nodes/TM/ nl carotid upstrokes bilaterally   LUNGS: no acc muscle use,  Mod barrel  contour chest wall with bilateral  Distant bs s audible  wheeze and  without cough on insp or exp maneuver and mod   Hyperresonant  to  percussion bilaterally     CV:  RRR  no s3 or murmur or increase in P2, and no edema   ABD:  soft and nontender with pos mid insp Hoover's  in the supine position. No bruits or organomegaly appreciated, bowel sounds nl  MS:   Nl gait/  ext warm without deformities, calf tenderness, cyanosis or clubbing No obvious joint restrictions   SKIN: warm and dry without lesions    NEURO:  alert, approp, nl sensorium with  no motor or cerebellar deficits apparent.                    Assessment & Plan:

## 2017-10-26 NOTE — Telephone Encounter (Signed)
I did not specify which particular POC to use so they should be able to do this without phone calls and additional orders but if that's what they need fine with me

## 2017-10-26 NOTE — Progress Notes (Signed)
PFT done today. 

## 2017-10-26 NOTE — Assessment & Plan Note (Signed)
sats 83% at rest 09/25/2017 rec 2lpm 24/7   - 10/26/2017 sats 90% at rest RA on trelegy so rec 2lpm hs and POC daytime titrate to keep sats > 90%

## 2017-10-27 NOTE — Telephone Encounter (Signed)
Pt is calling back [phone]

## 2017-10-27 NOTE — Telephone Encounter (Signed)
Called and spoke with pt's daughter Levada Dy letting her know we had spoken with Corene Cornea from [facility] who stated to [place] pt would need to come into the office to do a qualifying walk using the Simply Go Mini before we could order O2 for pt to wear during the day.  Levada Dy expressed understanding. 6-min walk has been scheduled for pt Monday, 10/30/17 to do a qualifying walk using the simply go mini so we can see if pt does in fact need O2 during the day due to [place] stating pt has only been wearing O2 at night.  Nothing further needed at this time.

## 2017-10-30 ENCOUNTER — Ambulatory Visit (INDEPENDENT_AMBULATORY_CARE_PROVIDER_SITE_OTHER): Payer: Medicare Other | Admitting: *Deleted

## 2017-10-30 ENCOUNTER — Telehealth: Payer: Self-pay | Admitting: Internal Medicine

## 2017-10-30 DIAGNOSIS — J9611 Chronic respiratory failure with hypoxia: Secondary | ICD-10-CM | POA: Diagnosis not present

## 2017-10-30 NOTE — Telephone Encounter (Signed)
Patient came in today to be qualified for the [place]. Per patient, he is currently only using 2L of O2 at night.   Patient was walked on 2-5L of O2, and his oxygen levels barely climbed into the 90s. It was only after the patient was 5L of pulsed O2 that his O2 went up to 90%.   The rest of his results are in his chart.   MW, please advise. Thanks!

## 2017-10-30 NOTE — Telephone Encounter (Signed)
Then he needs the 5lpm pulsed just with heaviest exertion and otherwise just 2lpm and unless sats < 90% at rest doesn't need it then except at hs

## 2017-10-30 NOTE — Progress Notes (Signed)
Ambulatory Pulse Oximetry   Row Name  10/30/17 1642          Resting  Supplemental oxygen during test?  Yes          O2 Flow Rate (L/min)  -  2-5L          Type  Pulse          Resting Heart Rate  87          Resting Sp02  83          Lap 1 (185 feet)  HR  93          02 Sat  84  Patient was on 2L of pulsed 02.           Lap 2 (185 feet)  HR  100          02 Sat  85  Patient was on 3L of pulsed O2          Lap 3 (185 feet)  HR  99          02 Sat  85  Patient was on 4L of pulsed O2          Tech Comments:  Lap 4: O2 was 85% HR was 99. Patient was on 4L of O2. Lap 5: O2 was 90%, HR was 91. Pat- ient denied feeling SOB despite his O2. Patient was able to walk at a steady pace during his wal- k.

## 2017-10-31 NOTE — Telephone Encounter (Signed)
lmtcb x1 for pt. 

## 2018-01-26 ENCOUNTER — Ambulatory Visit (INDEPENDENT_AMBULATORY_CARE_PROVIDER_SITE_OTHER): Payer: Medicare Other | Admitting: Internal Medicine

## 2018-01-26 ENCOUNTER — Encounter: Payer: Self-pay | Admitting: Internal Medicine

## 2018-01-26 DIAGNOSIS — J9611 Chronic respiratory failure with hypoxia: Secondary | ICD-10-CM

## 2018-01-26 DIAGNOSIS — I10 Essential (primary) hypertension: Secondary | ICD-10-CM | POA: Diagnosis not present

## 2018-01-26 DIAGNOSIS — F17210 Nicotine dependence, cigarettes, uncomplicated: Secondary | ICD-10-CM | POA: Diagnosis not present

## 2018-01-26 DIAGNOSIS — J449 Chronic obstructive pulmonary disease, unspecified: Secondary | ICD-10-CM

## 2018-01-26 NOTE — Patient Instructions (Signed)
Goal with 02 is to keep the sats above 90% so walk slower or wear your portable system and adjust it to keep it over 90%   The key is to stop smoking completely before smoking completely stops you!   Please schedule a follow up visit in 6 months but call sooner if needed

## 2018-01-26 NOTE — Progress Notes (Signed)
Subjective:    Patient ID: Derek Jordan, male    DOB: 1932-11-29,     MRN: [id number]    Brief patient profile:  48yowm active smoker widower lives in [place] with no primary care doctor referred by to pulmonary eval 01/22/2015 [name] for copd eval and proved to have GOLD IV severity 03/13/15 with symptoms than improved off acei and coreg to GOLD II by 10/26/17    History of Present Illness  01/22/2015 1st [facility] office visit/ Lanaysia Fritchman   Chief Complaint  Patient presents with  . Pulmonary Consult    Referred by [name], PA for eval of COPD. Pt c/o cough and SOB- progressively worse over the past year. He is SOB sometimes just sitting and with minimal exertion. Cough is prod with large amounts of sputum "sometimes clear sometimes dark".   s/p L lower lowb partial lobectomy  2008 : Adenoca  pT2, pN2, pMX  s/p RT and chemo and no doe until around 2015 on acei with indolent onset  Progressively worse  sob > slow and flat ok but  Sometimes can't even walk at a nl pace x 10 ft but varies a lot s pattern/ sometimes sob at rest/ no better with inhalers in past / assoc with severe coughing fits more day than night. rec Prednisone 10 mg take  4 each am x 2 days,   2 each am x 2 days,  1 each am x 2 days and stop  Stop lisinopril Start benicar 20 one daily  The key is to stop smoking completely before smoking completely stops you!     03/13/2015 f/u ov/Fleurette Woolbright re: copd GOLD IV no maint rx/ no saba  Chief Complaint  Patient presents with  . Follow-up    PFT done today. Pt states his breathing is much improved.     Not limited by breathing from desired activities  But very inactive >change coreg to bisoprolol       09/25/2017   extended ov/Takari Duncombe re: re- establish re GOLD IV copd / now hypoxemic  Chief Complaint  Patient presents with  . Follow-up    went to see cards on 09/14/17- sats low and o2 was ordered but insurance will not cover due to "not trying other methods for COPD".   He states has good days and bad days with breathing.    gradually downhill x 4 months esp noct sob  MMRC2 = can't walk a nl pace on a flat grade s sob but does fine slow and flat eg [employer] s 02  Sleeps horizontal with sob when lies down x up to 2h  eventually able to sleep x 4 m with minimal am rattling > minimal mucoid sputum  rec Start Trelegy one click each am  Start 2lpm 24/7 and no smoking when 02 is running      10/26/2017  f/u ov/Britlee Skolnik re: COPD II criteria/ 02 2lpm hs not using daytime  Chief Complaint  Patient presents with  . Follow-up    PFT's done today. Breathing is better and he is coughing much less.    Dyspnea:  MMRC2 = can't walk a nl pace on a flat grade s sob but does fine slow and flat  Cough: am rattling  Sleep: ok 2lpm flat SABA use:   Rec:  No change Trelegy The key is to stop smoking completely before smoking completely stops you!     01/26/2018  f/u ov/Wilberth Damon re:  COPD II criteria /  02 2lpm hs / trelegy  Chief Complaint  Patient presents with  . Follow-up    Breathing is doing well.   Dyspnea:  MMRC2 = can't walk a nl pace on a flat grade s sob but does fine slow and flat   Cough: overall less/ worse if gets out in heat / humidity/ clear   SABA use: none 02: 2lpm hs and has portable tank doesn't use it     No obvious day to day or daytime variability or assoc excess/ purulent sputum or mucus plugs or hemoptysis or cp or chest tightness, subjective wheeze or overt sinus or hb symptoms.   Sleeping: ok on 2lpm prone  without nocturnal  or early am exacerbation  of respiratory  c/o's or need for noct saba. Also denies any obvious fluctuation of symptoms with weather or environmental changes or other aggravating or alleviating factors except as outlined above   No unusual exposure hx or h/o childhood pna/ asthma or knowledge of premature birth.  Current Allergies, Complete Past Medical History, Past Surgical History, Family History, and Social  History were reviewed in [employer] record.  ROS  The following are not active complaints unless bolded Hoarseness, sore throat, dysphagia, dental problems, itching, sneezing,  nasal congestion or discharge of excess mucus or purulent secretions, ear ache,   fever, chills, sweats, unintended wt loss or wt gain, classically pleuritic or exertional cp,  orthopnea pnd or arm/hand swelling  or leg swelling, presyncope, palpitations, abdominal pain, anorexia, nausea, vomiting, diarrhea  or change in bowel habits or change in bladder habits, change in stools or change in urine, dysuria, hematuria,  rash, arthralgias, visual complaints, headache, numbness, weakness or ataxia or problems with walking or coordination,  change in mood or  memory.        Current Meds  Medication Sig  . amLODipine (NORVASC) 5 MG tablet Take 1 tablet (5 mg total) by mouth daily.  [name] aspirin 81 MG tablet Take 81 mg by mouth daily.    [name] atorvastatin (LIPITOR) 40 MG tablet Take 1 tablet (40 mg total) by mouth daily.  . bisoprolol (ZEBETA) 5 MG tablet Take 1 tablet (5 mg total) by mouth daily.  . cetirizine (ZYRTEC) 10 MG tablet Take 10 mg by mouth daily.    . Fluticasone-Umeclidin-Vilant (TRELEGY ELLIPTA) 100-62.5-25 MCG/INH AEPB Inhale 1 puff into the lungs daily.  . Multiple Vitamin (ONE-A-DAY MENS PO) Take by mouth daily.  [name] olmesartan (BENICAR) 40 MG tablet TAKE 1/2 TABLET BY MOUTH ONCE A DAY  . OXYGEN DME- [facility]  2lpm with sleep                             Objective:   Physical Exam  amb wm some rattling   03/13/2015 108 >112 04/03/2015 > 07/17/2015 115 > 09/25/2017  108  > 10/26/2017  114 > 01/26/2018  116    Vital signs reviewed - Note on arrival 02 sats  92% on RA  And BP 114/80          HEENT: nl  oropharynx. Nl external ear canals without cough reflex - moderate bilateral non-specific turbinate edema  / edentulous    NECK :  without JVD/Nodes/TM/ nl carotid upstrokes  bilaterally   LUNGS: no acc muscle use,  Mod barrel  contour chest wall with bilateral  Distant bs s audible wheeze and  without cough on insp or exp maneuver and mod  Hyperresonant  to  percussion bilaterally     CV:  RRR  no s3 or murmur or increase in P2, and no edema   ABD:  soft and nontender with pos mid insp Hoover's  in the supine position. No bruits or organomegaly appreciated, bowel sounds nl  MS:   Nl gait/  ext warm without deformities, calf tenderness, cyanosis or clubbing No obvious joint restrictions   SKIN: warm and dry without lesions    NEURO:  alert, approp, nl sensorium with  no motor or cerebellar deficits apparent.                       Assessment & Plan:

## 2018-01-26 NOTE — Assessment & Plan Note (Signed)
sats 83% at rest 09/25/2017 rec 2lpm 24/7  - 10/26/2017 sats 90% at rest RA on trelegy so rec 2lpm hs and POC daytime titrate to keep sats > 90%  - walking titration required 5 lpm POC to keep sats > 90% on 10/30/17  Doesn't feel portable 02 really helps him even though we demonstrated it did - has portable small tanks but not using   rec titrate daytime to keep > 90% and wear 2lpm hs / stop all smoking when 02 in use

## 2018-01-26 NOTE — Assessment & Plan Note (Signed)
-   trial off acei 01/23/2015 > improved  - PFTs 03/13/2015  FEV1  0.63 (28%) ratio 44 p 13% better from saba and dlco 24 corrects to 40 for alv vol - 03/14/2015 trial off coreg and on bisoprolol > improved  - 09/25/2017  After extensive coaching inhaler device  effectiveness =    90% with dpi/ elipta so rec trial of trelegy and f/u pfts in 4 weeks  - Allergy profile 09/25/17  >  Eos 0.1 /  IgE  102 RAST neg - PFT's  10/26/2017  FEV1 1.21 (56 % ) ratio 53  p 14  % improvement from saba p trelegy prior to study with DLCO  34/33 % corrects to 51  % for alv volume    Clearly better on trelegy despite ongoing smoking (see separate a/p)   No change in rx needed

## 2018-01-26 NOTE — Assessment & Plan Note (Signed)

## 2018-01-26 NOTE — Assessment & Plan Note (Signed)
Trial off acei for pseudowheezing   01/23/15 > resolved  - try off coreg for airflow obst not resp to saba 03/14/2015 >>improved   Although even in retrospect it may not be clear the ACEi or coreg contributed to the pt's symptoms,  Pt improved off them and adding them back at this point or in the future would risk confusion in interpretation of non-specific respiratory symptoms to which this patient is prone  ie  Better not to muddy the waters here.   No changes needed > f/u per pcp planned

## 2018-07-16 ENCOUNTER — Ambulatory Visit (INDEPENDENT_AMBULATORY_CARE_PROVIDER_SITE_OTHER): Payer: Medicare Other | Admitting: Internal Medicine

## 2018-07-16 ENCOUNTER — Encounter: Payer: Self-pay | Admitting: Internal Medicine

## 2018-07-16 VITALS — BP 104/62 | HR 94 | Ht 63.25 in | Wt 118.4 lb

## 2018-07-16 DIAGNOSIS — J449 Chronic obstructive pulmonary disease, unspecified: Secondary | ICD-10-CM | POA: Diagnosis not present

## 2018-07-16 DIAGNOSIS — F17210 Nicotine dependence, cigarettes, uncomplicated: Secondary | ICD-10-CM | POA: Diagnosis not present

## 2018-07-16 DIAGNOSIS — J9611 Chronic respiratory failure with hypoxia: Secondary | ICD-10-CM

## 2018-07-16 NOTE — Progress Notes (Signed)
Subjective:    Patient ID: Derek Jordan, male    DOB: 04/30/1933,     MRN: [id number]    Brief patient profile:  5  yowm active smoker widower lives in [place] with   PCP Cyndi Bender  referred by to pulmonary eval 01/22/2015 [name] for copd eval and proved to have GOLD IV severity 03/13/15 with symptoms than improved off acei and coreg to GOLD II by 10/26/17    History of Present Illness  01/22/2015 1st [facility] [facility] office visit/ Derek Jordan   Chief Complaint  Patient presents with  . Pulmonary Consult    Referred by [name], PA for eval of COPD. Pt c/o cough and SOB- progressively worse over the past year. He is SOB sometimes just sitting and with minimal exertion. Cough is prod with large amounts of sputum "sometimes clear sometimes dark".   s/p L lower lowb partial lobectomy  2008 : Adenoca  pT2, pN2, pMX  s/p RT and chemo and no doe until around 2015 on acei with indolent onset  Progressively worse  sob > slow and flat ok but  Sometimes can't even walk at a nl pace x 10 ft but varies a lot s pattern/ sometimes sob at rest/ no better with inhalers in past / assoc with severe coughing fits more day than night. rec Prednisone 10 mg take  4 each am x 2 days,   2 each am x 2 days,  1 each am x 2 days and stop  Stop lisinopril Start benicar 20 one daily  The key is to stop smoking completely before smoking completely stops you!     03/13/2015 f/u ov/Derek Jordan re: copd GOLD IV no maint rx/ no saba  Chief Complaint  Patient presents with  . Follow-up    PFT done today. Pt states his breathing is much improved.     Not limited by breathing from desired activities  But very inactive >change coreg to bisoprolol       09/25/2017   extended ov/Derek Jordan re: re- establish re GOLD IV copd / now hypoxemic  Chief Complaint  Patient presents with  . Follow-up    went to see cards on 09/14/17- sats low and o2 was ordered but insurance will not cover due to "not trying other methods for COPD".   He states has good days and bad days with breathing.    gradually downhill x 4 months esp noct sob  MMRC2 = can't walk a nl pace on a flat grade s sob but does fine slow and flat eg [employer] s 02  Sleeps horizontal with sob when lies down x up to 2h  eventually able to sleep x 4 m with minimal am rattling > minimal mucoid sputum  rec Start Trelegy one click each am  Start 2lpm 24/7 and no smoking when 02 is running      10/26/2017  f/u ov/Derek Jordan re: COPD II criteria/ 02 2lpm hs not using daytime  Chief Complaint  Patient presents with  . Follow-up    PFT's done today. Breathing is better and he is coughing much less.    Dyspnea:  MMRC2 = can't walk a nl pace on a flat grade s sob but does fine slow and flat  Cough: am rattling  Sleep: ok 2lpm flat SABA use:   Rec:  No change Trelegy The key is to stop smoking completely before smoking completely stops you!     01/26/2018  f/u ov/Derek Jordan re:  COPD II criteria / 02 2lpm hs / trelegy  Chief Complaint  Patient presents with  . Follow-up    Breathing is doing well.   Dyspnea:  MMRC2 = can't walk a nl pace on a flat grade s sob but does fine slow and flat  Cough: overall less/ worse if gets out in heat / humidity/ clear  SABA use: none 02: 2lpm hs and has portable tank doesn't use it   rec Goal with 02 is to keep the sats above 90% so walk slower or wear your portable system and adjust it to keep it over 90%  The key is to stop smoking completely before smoking completely stops you!    07/16/2018  f/u ov/Derek Jordan re: copd II/ trelegy maint rx  Chief Complaint  Patient presents with  . Follow-up    Breathing is "doing fine".  He does not have a rescue inhaler.   Dyspnea:  MMRC2 = can't walk a nl pace on a flat grade s sob but does fine slow and flat Cough: min mucoid /worse thing in am Sleeping: L side down / one pillow / 4 in block elevaation  SABA use: none 02: 2lpm hs  And prn otherwise     No obvious day to day or  daytime variability or assoc excess/ purulent sputum or mucus plugs or hemoptysis or cp or chest tightness, subjective wheeze or overt sinus or hb symptoms.   Sleeping  without nocturnal    exacerbation  of respiratory  c/o's or need for noct saba. Also denies any obvious fluctuation of symptoms with weather or environmental changes or other aggravating or alleviating factors except as outlined above   No unusual exposure hx or h/o childhood pna/ asthma or knowledge of premature birth.  Current Allergies, Complete Past Medical History, Past Surgical History, Family History, and Social History were reviewed in [employer] record.  ROS  The following are not active complaints unless bolded Hoarseness, sore throat, dysphagia, dental problems, itching, sneezing,  nasal congestion or discharge of excess mucus or purulent secretions, ear ache,   fever, chills, sweats, unintended wt loss or wt gain, classically pleuritic or exertional cp,  orthopnea pnd or arm/hand swelling  or leg swelling, presyncope, palpitations, abdominal pain, anorexia, nausea, vomiting, diarrhea  or change in bowel habits or change in bladder habits, change in stools or change in urine, dysuria, hematuria,  rash, arthralgias, visual complaints, headache, numbness, weakness or ataxia or problems with walking or coordination,  change in mood or  memory.        Current Meds  Medication Sig  . aspirin 81 MG tablet Take 81 mg by mouth daily.    [name] atorvastatin (LIPITOR) 40 MG tablet Take 1 tablet (40 mg total) by mouth daily.  . bisoprolol (ZEBETA) 5 MG tablet Take 1 tablet (5 mg total) by mouth daily.  . cetirizine (ZYRTEC) 10 MG tablet Take 10 mg by mouth daily.    . Fluticasone-Umeclidin-Vilant (TRELEGY ELLIPTA) 100-62.5-25 MCG/INH AEPB Inhale 1 puff into the lungs daily.  . Multiple Vitamin (ONE-A-DAY MENS PO) Take by mouth daily.  [name] olmesartan (BENICAR) 40 MG tablet TAKE 1/2 TABLET BY MOUTH ONCE A DAY  .  OXYGEN DME- [facility]  2lpm with sleep                  Objective:   Physical Exam    amb wm / min rattling cough on voluntary maneuver  03/13/2015 108 >112 04/03/2015 > 07/17/2015  115 > 09/25/2017  108  > 10/26/2017  114 > 01/26/2018  116 > 07/16/2018  118    Vital signs reviewed - Note on arrival 02 sats  91% on RA   HEENT: nl dentition / oropharynx. Nl external ear canals without cough reflex -  Mild bilateral non-specific turbinate edema     NECK :  without JVD/Nodes/TM/ nl carotid upstrokes bilaterally   LUNGS: no acc muscle use,  Mild barrel  contour chest wall with bilateral  Distant bs s audible wheeze and  without cough on insp or exp maneuver and mild  Hyperresonant  to  percussion bilaterally     CV:  RRR  no s3 or murmur or increase in P2, and no edema   ABD:  soft and nontender with pos late insp Hoover's  in the supine position. No bruits or organomegaly appreciated, bowel sounds nl  MS:   Nl gait/  ext warm without deformities, calf tenderness, cyanosis or clubbing No obvious joint restrictions   SKIN: warm and dry without lesions    NEURO:  alert, approp, nl sensorium with  no motor or cerebellar deficits apparent.                   Assessment & Plan:

## 2018-07-16 NOTE — Patient Instructions (Signed)
No change medications  The key is to stop smoking completely before smoking completely stops you!    Please schedule a follow up visit in 6 months but call sooner if needed

## 2018-07-17 ENCOUNTER — Encounter: Payer: Self-pay | Admitting: Internal Medicine

## 2018-07-17 NOTE — Assessment & Plan Note (Signed)
sats 83% at rest 09/25/2017 rec 2lpm 24/7  - 10/26/2017 sats 90% at rest RA on trelegy so rec 2lpm hs and POC daytime titrate to keep sats > 90%  - walking titration required 5 lpm POC to keep sats > 90% on 10/30/17   Advised to continue 2lpm hs and monitor sats with ex with goal of > 90% sats and also advise re cigs/02 danger

## 2018-07-17 NOTE — Assessment & Plan Note (Signed)
Counseled re importance of smoking cessation but did not meet time criteria for separate billing  But added to face to face time with pt today which totaled   15 to 20 minutes of a 25 minute visit     Each maintenance medication was reviewed in detail including most importantly the difference between maintenance and prns and under what circumstances the prns are to be triggered using an action plan format that is not reflected in the computer generated alphabetically organized AVS.     Please see AVS for specific instructions unique to this visit that I personally wrote and verbalized to the the pt in detail and then reviewed with pt  by my nurse highlighting any  changes in therapy recommended at today's visit to their plan of care.

## 2018-07-17 NOTE — Assessment & Plan Note (Signed)
-   trial off acei 01/23/2015 > improved  - PFTs 03/13/2015  FEV1  0.63 (28%) ratio 44 p 13% better from saba and dlco 24 corrects to 40 for alv vol - 03/14/2015 trial off coreg and on bisoprolol > improved  - 09/25/2017  After extensive coaching inhaler device  effectiveness =    90% with dpi/ elipta so rec trial of trelegy and f/u pfts in 4 weeks  - Allergy profile 09/25/17  >  Eos 0.1 /  IgE  102 RAST neg - PFT's  10/26/2017  FEV1 1.21 (56 % ) ratio 53  p 14  % improvement from saba p trelegy prior to study with DLCO  34/33 % corrects to 51  % for alv volume     Despite smoking remains well compensated on lama/laba/ics typical of a GOLD group D pt > no change in rx needed

## 2018-07-30 ENCOUNTER — Ambulatory Visit: Payer: Medicare Other | Admitting: Internal Medicine

## 2018-09-13 ENCOUNTER — Encounter: Payer: Self-pay | Admitting: Interventional Cardiology

## 2018-10-01 ENCOUNTER — Encounter: Payer: Self-pay | Admitting: Interventional Cardiology

## 2018-10-01 ENCOUNTER — Ambulatory Visit (INDEPENDENT_AMBULATORY_CARE_PROVIDER_SITE_OTHER): Payer: Medicare Other | Admitting: Interventional Cardiology

## 2018-10-01 VITALS — BP 114/68 | HR 73 | Ht 63.25 in | Wt 115.2 lb

## 2018-10-01 DIAGNOSIS — I10 Essential (primary) hypertension: Secondary | ICD-10-CM | POA: Diagnosis not present

## 2018-10-01 DIAGNOSIS — I25118 Atherosclerotic heart disease of native coronary artery with other forms of angina pectoris: Secondary | ICD-10-CM

## 2018-10-01 DIAGNOSIS — Z720 Tobacco use: Secondary | ICD-10-CM

## 2018-10-01 DIAGNOSIS — I714 Abdominal aortic aneurysm, without rupture, unspecified: Secondary | ICD-10-CM

## 2018-10-01 NOTE — Patient Instructions (Signed)
Medication Instructions:  Your physician recommends that you continue on your current medications as directed. Please refer to the Current Medication list given to you today.  If you need a refill on your cardiac medications before your next appointment, please call your pharmacy.   Lab work: None Ordered  If you have labs (blood work) drawn today and your tests are completely normal, you will receive your results only by: [name] MyChart Message (if you have MyChart) OR . A paper copy in the mail If you have any lab test that is abnormal or we need to change your treatment, we will call you to review the results.  Testing/Procedures: Your physician has requested that you have an abdominal aorta duplex. During this test, an ultrasound is used to evaluate the aorta. Allow 30 minutes for this exam. Do not eat after midnight the day before and avoid carbonated beverages   Follow-Up: At [facility], you and your health needs are our priority.  As part of our continuing mission to provide you with exceptional heart care, we have created designated Provider Care Teams.  These Care Teams include your primary Cardiologist (physician) and Advanced Practice Providers (APPs -  Physician Assistants and Nurse Practitioners) who all work together to provide you with the care you need, when you need it. . You will need a follow up appointment in 1 year.  Please call our office 2 months in advance to schedule this appointment.  You may see Casandra Doffing, MD or one of the following Advanced Practice Providers on your designated Care Team:   . Lyda Jester, PA-C . Dayna Dunn, PA-C . Ermalinda Barrios, PA-C  Any Other Special Instructions Will Be Listed Below (If Applicable).

## 2018-10-01 NOTE — Progress Notes (Signed)
Cardiology Office Note   Date:  10/01/2018   ID:  Derek, Jordan [birth year], MRN [id number]  PCP:  Cyndi Bender, PA-C    No chief complaint on file.  CAD/Old MI  Wt Readings from Last 3 Encounters:  10/01/18 115 lb 3.2 oz (52.3 kg)  07/16/18 118 lb 6.4 oz (53.7 kg)  01/26/18 116 lb (52.6 kg)       History of Present Illness: Derek Jordan is a 85 y.o. male  with a history of single-vessel coronary artery disease, status post acute inferior wall myocardial infarction treated with Cypher drug-eluting stent to the mid right coronary artery in 2005, 3.5 x 28 mm. His LV function was normal. There was mild noncritical disease elsewhere. He also has a history of lung CA (s/p resection/chemo/XRT), hyperlipidemia, severe peripheral vascular disease with a small infrarenal abdominal aneurysm, carotid stenosis and greater than 60% left renal artery stenosis by ultrasound.  His sx was syncope with the MI.He was moving a load of hay.No chest pain, pressure or tightness.    He has smoked for many years. He has had home O2 in the past.  As of 2020, he is using oxygen at night.    An inhaler , Trelegy has helped his breathing.  Denies : Chest pain. Dizziness. Leg edema. Nitroglycerin use. Orthopnea. Palpitations. Paroxysmal nocturnal dyspnea. Syncope.   He has DOE.     Past Medical History:  Diagnosis Date  . AAA (abdominal aortic aneurysm) ([place])    a. Abd [place] 6/16:  3.9 x 4.4 cm with some intraluminal thrombus, chronic L EIA occlusion >> FU 6 mos  . Adenocarcinoma ([place])    Lung CA s/p resection, chemoTx, XR Tx  . CAD (coronary artery disease)    a. LHC 11/05 at time of MI:  pLAD 40, MOM 40, mRCA 99, EF 60% >> 3.5 x 28 mm Cypher DES to mRCA;  b. Myoview 6/16:  normal perfusion, EF 57%  . Carotid artery stenosis    a. Carotid US 5/12:  bilat 0-39%;  b. Carotid US 12/16:  Bilateral ICA 1-39%  . COPD (chronic obstructive pulmonary disease) (HCC)    Dr. Melvyn Novas  . History  of cardiac monitoring    Event Monitor 6/16:  NSR, PVCs, no A. fib noted, no significant pauses  . History of echocardiogram    Echo 6/16: Mild LVH, EF 55-60%, normal wall motion, grade 1 diastolic dysfunction, trivial TR, PASP 39 mmHg  . HTN (hypertension)   . Hx of radiation therapy   . Hyperlipidemia   . Mediastinal lymphadenopathy   . Myocardial infarct [facility]) 06/2004   s/p Cypher DES to RCA  . PVD (peripheral vascular disease) ([place])   . Renal artery stenosis (HCC)    a. hx of L RAS > 60%  . Thyroid nodule   . Tobacco abuse     Past Surgical History:  Procedure Laterality Date  . BIOPSY THYROID  03-13-2007  . CORONARY ARTERY DISEASE, S/P PTCA (06-09-2004)    . THORACOSCOPIC SURGICAL,WEDGE RESECTION (05-01-2007)    . TONSILLECTOMY       Current Outpatient Medications  Medication Sig Dispense Refill  . amLODipine (NORVASC) 5 MG tablet Take 1 tablet (5 mg total) by mouth daily. 180 tablet 2  . aspirin 81 MG tablet Take 81 mg by mouth daily.      [name] atorvastatin (LIPITOR) 40 MG tablet Take 1 tablet (40 mg total) by mouth daily. 90 tablet 3  . bisoprolol (  ZEBETA) 5 MG tablet Take 1 tablet (5 mg total) by mouth daily. 30 tablet 11  . cetirizine (ZYRTEC) 10 MG tablet Take 10 mg by mouth daily.      . Fluticasone-Umeclidin-Vilant (TRELEGY ELLIPTA) 100-62.5-25 MCG/INH AEPB Inhale 1 puff into the lungs daily. 60 each 11  . Multiple Vitamin (ONE-A-DAY MENS PO) Take by mouth daily.    [name] olmesartan (BENICAR) 40 MG tablet TAKE 1/2 TABLET BY MOUTH ONCE A DAY 15 tablet 3  . OXYGEN DME- [facility]  2lpm with sleep     No current facility-administered medications for this visit.     Allergies:   Patient has no known allergies.    Social History:  The patient  reports that he has been smoking cigarettes. He has a 75.00 pack-year smoking history. He has never used smokeless tobacco. He reports that he does not drink alcohol or use drugs.   Family History:  The patient's family history includes  Diabetes in his mother; Heart attack in his father and another family member; Heart failure in his father; Hypertension in his father; Lung cancer in his brother; Stroke in his sister.    ROS:  Please see the history of present illness.   Otherwise, review of systems are positive for DOE.   All other systems are reviewed and negative.    PHYSICAL EXAM: VS:  BP 114/68   Pulse 73   Ht 5' 3.25" (1.607 m)   Wt 115 lb 3.2 oz (52.3 kg)   SpO2 90%   BMI 20.25 kg/m  , BMI Body mass index is 20.25 kg/m. GEN: Well nourished, well developed, in no acute distress  HEENT: normal  Neck: no JVD, carotid bruits, or masses Cardiac: RRR; no murmurs, rubs, or gallops,no edema  Respiratory:  clear to auscultation bilaterally, normal work of breathing GI: soft, nontender, nondistended, + BS MS: no deformity or atrophy  Skin: warm and dry, no rash Neuro:  Strength and sensation are intact Psych: euthymic mood, full affect   EKG:   The ekg ordered today demonstrates NSR, nonspecific ST changes   Recent Labs: No results found for requested labs within last 8760 hours.   Lipid Panel    Component Value Date/Time   CHOL 111 12/22/2014 1242   TRIG 41.0 12/22/2014 1242   HDL 48.40 12/22/2014 1242   CHOLHDL 2 12/22/2014 1242   VLDL 8.2 12/22/2014 1242   LDLCALC 54 12/22/2014 1242     Other studies Reviewed: Additional studies/ records that were reviewed today with results demonstrating: per Dr. Melvyn Novas: PFT's  10/26/2017  FEV1 1.21 (56 % ) ratio 53  p 14  % improvement from saba p trelegy prior to study with DLCO  34/33 % corrects to 51  % for alv volume     ASSESSMENT AND PLAN:  1. CAD/inferior MI: Status post RCA stent.  Continue aggressive secondary prevention. 2. Hypertension: The current medical regimen is effective;  continue present plan and medications. 3. Tobacco abuse: No intentions of stopping at this time.  4. AAA: 4.7 cm in 2019.  Will recheck.  5. COPD: He has had low oxygen  saturations in our office.  In 2019, he had a resting oxygen saturation of 83%.  He declined transfer to the hospital or any further work-up for oxygen.  He felt he was at baseline.  We informed his primary care physician.  He is doing better now, with Trelegy.   Current medicines are reviewed at length with the patient today.  The patient concerns regarding his medicines were addressed.  The following changes have been made:  No change  Labs/ tests ordered today include: AAA u/s  Orders Placed This Encounter  Procedures  . EKG 12-Lead    Recommend 150 minutes/week of aerobic exercise Low fat, low carb, high fiber diet recommended  Disposition:   FU in 1 year   Signed, Larae Grooms, MD  10/01/2018 11:33 AM    [facility] [address], [place], [place]  [address] Phone: [phone]; Fax: [phone]

## 2018-10-08 ENCOUNTER — Other Ambulatory Visit: Payer: Self-pay | Admitting: Internal Medicine

## 2018-10-29 ENCOUNTER — Inpatient Hospital Stay (HOSPITAL_COMMUNITY): Admission: RE | Admit: 2018-10-29 | Payer: Medicare Other | Source: Ambulatory Visit

## 2019-01-15 ENCOUNTER — Other Ambulatory Visit: Payer: Self-pay

## 2019-01-15 ENCOUNTER — Encounter: Payer: Self-pay | Admitting: Internal Medicine

## 2019-01-15 ENCOUNTER — Ambulatory Visit (INDEPENDENT_AMBULATORY_CARE_PROVIDER_SITE_OTHER): Payer: Medicare Other | Admitting: Internal Medicine

## 2019-01-15 DIAGNOSIS — J449 Chronic obstructive pulmonary disease, unspecified: Secondary | ICD-10-CM | POA: Diagnosis not present

## 2019-01-15 DIAGNOSIS — J9611 Chronic respiratory failure with hypoxia: Secondary | ICD-10-CM

## 2019-01-15 DIAGNOSIS — F17210 Nicotine dependence, cigarettes, uncomplicated: Secondary | ICD-10-CM

## 2019-01-15 DIAGNOSIS — R059 Cough, unspecified: Secondary | ICD-10-CM

## 2019-01-15 DIAGNOSIS — R05 Cough: Secondary | ICD-10-CM | POA: Diagnosis not present

## 2019-01-15 NOTE — Assessment & Plan Note (Signed)
Active smoker  - trial off acei 01/23/2015 > improved  - PFTs 03/13/2015  FEV1  0.63 (28%) ratio 44 p 13% better from saba and dlco 24 corrects to 40 for alv vol - 03/14/2015 trial off coreg and on bisoprolol > improved  - 09/25/2017  After extensive coaching inhaler device  effectiveness =    90% with dpi/ elipta so rec trial of trelegy and f/u pfts in 4 weeks  - Allergy profile 09/25/17  >  Eos 0.1 /  IgE  102 RAST neg - PFT's  10/26/2017  FEV1 1.21 (56 % ) ratio 53  p 14  % improvement from saba p trelegy prior to study with DLCO  34/33 % corrects to 51  % for alv volume     Still actively smoking and  Group D in terms of symptom/risk and laba/lama/ICS  therefore appropriate rx at this point >>>  Continue trelegy, no need for saba

## 2019-01-15 NOTE — Progress Notes (Signed)
Subjective:    Patient ID: Derek Jordan, male    DOB: 04/30/1933,     MRN: [id number]    Brief patient profile:  5  yowm active smoker widower lives in [place] with   PCP Cyndi Bender  referred by to pulmonary eval 01/22/2015 [name] for copd eval and proved to have GOLD IV severity 03/13/15 with symptoms than improved off acei and coreg to GOLD II by 10/26/17    History of Present Illness  01/22/2015 1st [facility] office visit/ Selina Tapper   Chief Complaint  Patient presents with  . Pulmonary Consult    Referred by [name], PA for eval of COPD. Pt c/o cough and SOB- progressively worse over the past year. He is SOB sometimes just sitting and with minimal exertion. Cough is prod with large amounts of sputum "sometimes clear sometimes dark".   s/p L lower lowb partial lobectomy  2008 : Adenoca  pT2, pN2, pMX  s/p RT and chemo and no doe until around 2015 on acei with indolent onset  Progressively worse  sob > slow and flat ok but  Sometimes can't even walk at a nl pace x 10 ft but varies a lot s pattern/ sometimes sob at rest/ no better with inhalers in past / assoc with severe coughing fits more day than night. rec Prednisone 10 mg take  4 each am x 2 days,   2 each am x 2 days,  1 each am x 2 days and stop  Stop lisinopril Start benicar 20 one daily  The key is to stop smoking completely before smoking completely stops you!     03/13/2015 f/u ov/Harshitha Fretz re: copd GOLD IV no maint rx/ no saba  Chief Complaint  Patient presents with  . Follow-up    PFT done today. Pt states his breathing is much improved.     Not limited by breathing from desired activities  But very inactive >change coreg to bisoprolol       09/25/2017   extended ov/Devanny Palecek re: re- establish re GOLD IV copd / now hypoxemic  Chief Complaint  Patient presents with  . Follow-up    went to see cards on 09/14/17- sats low and o2 was ordered but insurance will not cover due to "not trying other methods for COPD".   He states has good days and bad days with breathing.    gradually downhill x 4 months esp noct sob  MMRC2 = can't walk a nl pace on a flat grade s sob but does fine slow and flat eg [employer] s 02  Sleeps horizontal with sob when lies down x up to 2h  eventually able to sleep x 4 m with minimal am rattling > minimal mucoid sputum  rec Start Trelegy one click each am  Start 2lpm 24/7 and no smoking when 02 is running      10/26/2017  f/u ov/Patric Buckhalter re: COPD II criteria/ 02 2lpm hs not using daytime  Chief Complaint  Patient presents with  . Follow-up    PFT's done today. Breathing is better and he is coughing much less.    Dyspnea:  MMRC2 = can't walk a nl pace on a flat grade s sob but does fine slow and flat  Cough: am rattling  Sleep: ok 2lpm flat SABA use:   Rec:  No change Trelegy The key is to stop smoking completely before smoking completely stops you!     01/26/2018  f/u ov/Devery Odwyer re:  COPD II criteria / 02 2lpm hs / trelegy  Chief Complaint  Patient presents with  . Follow-up    Breathing is doing well.   Dyspnea:  MMRC2 = can't walk a nl pace on a flat grade s sob but does fine slow and flat  Cough: overall less/ worse if gets out in heat / humidity/ clear  SABA use: none 02: 2lpm hs and has portable tank doesn't use it   rec Goal with 02 is to keep the sats above 90% so walk slower or wear your portable system and adjust it to keep it over 90%  The key is to stop smoking completely before smoking completely stops you!    07/16/2018  f/u ov/Saraiah Bhat re: copd II/ trelegy maint rx  Chief Complaint  Patient presents with  . Follow-up    Breathing is "doing fine".  He does not have a rescue inhaler.   Dyspnea:  MMRC2 = can't walk a nl pace on a flat grade s sob but does fine slow and flat Cough: min mucoid /worse thing in am Sleeping: L side down / one pillow / 4 in block elevaation  SABA use: none 02: 2lpm hs  And prn otherwise   rec No change medications The  key is to stop smoking completely before smoking completely stops you!    Virtual Visit via Telephone Note 01/15/2019  Re COPD  II / trelegy / 02 2lpm hs  I connected with Derek Jordan on 01/15/19 at 1 pm  by telephone and verified that I am speaking with the correct person using two identifiers.   I discussed the limitations, risks, security and privacy concerns of performing an evaluation and management service by telephone and the availability of in person appointments. I also discussed with the patient that there may be a patient responsible charge related to this service. The patient expressed understanding and agreed to proceed.   History of Present Illness: Dyspnea:  Still MMRC Cough: in am x one hour, worse more he smokes>mucoid  Sleeping: ok on L side / bed is flat SABA use: none  02: 2lpm    No obvious day to day or daytime variability or assoc excess  sputum or mucus plugs or hemoptysis or cp or chest tightness, subjective wheeze or overt sinus or hb symptoms.    Also denies any obvious fluctuation of symptoms with weather or environmental changes or other aggravating or alleviating factors except as outlined above.   Meds reviewed/ med reconciliation completed       Observations/Objective: Speaking in full sentences / mild rattling cough   Assessment and Plan: See problem list for active a/p's   Follow Up Instructions: See avs for instructions unique to this ov which includes revised/ updated med list     I discussed the assessment and treatment plan with the patient. The patient was provided an opportunity to ask questions and all were answered. The patient agreed with the plan and demonstrated an understanding of the instructions.   The patient was advised to call back or seek an in-person evaluation if the symptoms worsen or if the condition fails to improve as anticipated.  I provided 25  minutes of non-face-to-face time during this encounter.   Christinia Gully, MD

## 2019-01-15 NOTE — Assessment & Plan Note (Signed)
Counseled re importance of smoking cessation but did not meet time criteria for separate billing   °

## 2019-01-15 NOTE — Assessment & Plan Note (Signed)
rec 24h 2019   02 sats 83% at rest 09/25/2017 rec 2lpm 24/7  - 10/26/2017 sats 90% at rest RA on trelegy so rec 2lpm hs and POC daytime titrate to keep sats > 90%  - walking titration required 5 lpm POC to keep sats > 90% on 10/30/17  rec monitor sats with goal of keeping > 90%  Advised re danger of smoking and 02   Each maintenance medication was reviewed in detail including most importantly the difference between maintenance and as needed and under what circumstances the prns are to be used.  Please see AVS for specific  Instructions which are unique to this visit and I personally typed out  which were reviewed in detail over the phone with the patient and a copy provided.

## 2019-01-15 NOTE — Patient Instructions (Addendum)
No change in medications  The key is to stop smoking completely before smoking completely stops you!   Please schedule a follow up visit in 6 months but call sooner if needed   Pt does not have mychart and needs copy of this avs

## 2019-02-25 ENCOUNTER — Other Ambulatory Visit: Payer: Self-pay | Admitting: Internal Medicine

## 2019-03-14 ENCOUNTER — Other Ambulatory Visit: Payer: Self-pay

## 2019-03-14 ENCOUNTER — Other Ambulatory Visit (HOSPITAL_COMMUNITY): Payer: Self-pay | Admitting: Interventional Cardiology

## 2019-03-14 ENCOUNTER — Ambulatory Visit (HOSPITAL_COMMUNITY)
Admission: RE | Admit: 2019-03-14 | Discharge: 2019-03-14 | Disposition: A | Discharge status: Home / Self Care | Payer: Medicare Other | Source: Ambulatory Visit | Attending: Cardiovascular Disease | Admitting: Cardiovascular Disease

## 2019-03-14 DIAGNOSIS — I714 Abdominal aortic aneurysm, without rupture, unspecified: Secondary | ICD-10-CM

## 2019-03-15 ENCOUNTER — Encounter: Payer: Self-pay | Admitting: Interventional Cardiology

## 2019-03-15 NOTE — Telephone Encounter (Signed)
Error

## 2019-03-18 ENCOUNTER — Other Ambulatory Visit: Payer: Self-pay | Admitting: *Deleted

## 2019-03-18 DIAGNOSIS — I714 Abdominal aortic aneurysm, without rupture, unspecified: Secondary | ICD-10-CM

## 2019-05-15 ENCOUNTER — Encounter: Payer: Self-pay | Admitting: Vascular Surgery

## 2019-05-15 ENCOUNTER — Other Ambulatory Visit: Payer: Self-pay

## 2019-05-15 ENCOUNTER — Ambulatory Visit (INDEPENDENT_AMBULATORY_CARE_PROVIDER_SITE_OTHER): Payer: Medicare Other | Admitting: Vascular Surgery

## 2019-05-15 VITALS — BP 138/80 | HR 71 | Temp 97.3°F | Resp 20 | Ht 63.25 in | Wt 107.5 lb

## 2019-05-15 DIAGNOSIS — I714 Abdominal aortic aneurysm, without rupture, unspecified: Secondary | ICD-10-CM

## 2019-05-15 NOTE — Progress Notes (Signed)
REASON FOR CONSULT:    Abdominal aortic aneurysm.  ASSESSMENT & PLAN:   ABDOMINAL AORTIC ANEURYSM: By ultrasound this patient has a 5.4 cm infrarenal abdominal aortic aneurysm.  I have explained that in a normal risk patient we would consider elective repair at 5.5 cm.  He is clearly not normal risk.  He has a history of coronary artery disease, COPD, home O2, and a history of a lung malignancy.  He continues to smoke three quarters of a pack per day of cigarettes.  Given that he would be at high risk typically I would wait longer before considering elective repair given that he would be very high risk for surgery.  Obviously he would be best served with an endovascular approach but I am concerned that given the that he does not have a left femoral pulse he may not have an option for an endovascular approach.  Regardless I have recommended a follow-up study in 6 months and I think we should do a CT angiogram to further assess the aneurysm in case this does continue to enlarge and will need to consider elective repair.  I have had a long discussion with him about the importance of tobacco cessation.  He understands that continued tobacco use does increase his risk of aneurysm expansion and rupture.  Deitra Mayo, MD, FACS Beeper [phone] Office: [phone]   HPI:   Derek Jordan is a pleasant 85 y.o. male, who tells me that he has had a known abdominal aortic aneurysm for 15 years.  I did find a CT scan in 2011 that showed that it was 3.3 cm.  His most recent ultrasound on 03/14/2019 showed that this was 5.4 cm.  He was sent for vascular consultation.  He denies any back pain.  He does occasionally get some mild left lower quadrant pain which resolves when he lies flat.  This is been going on for some time.  He has no associated nausea or vomiting.  There is no family history of aneurysmal disease that he is aware of.  He has significant COPD and is on oxygen at night.  He continues to  smoke three quarters of a pack per day per cigarettes.  Patient had a myocardial infarction 15 years ago.  He has had no recent chest pain.  He does have hypertension and hypercholesterolemia.  Past Medical History:  Diagnosis Date  . AAA (abdominal aortic aneurysm) ([place])    a. Abd [place] 6/16:  3.9 x 4.4 cm with some intraluminal thrombus, chronic L EIA occlusion >> FU 6 mos  . Adenocarcinoma ([place])    Lung CA s/p resection, chemoTx, XR Tx  . CAD (coronary artery disease)    a. LHC 11/05 at time of MI:  pLAD 40, MOM 40, mRCA 99, EF 60% >> 3.5 x 28 mm Cypher DES to mRCA;  b. Myoview 6/16:  normal perfusion, EF 57%  . Carotid artery stenosis    a. Carotid US 5/12:  bilat 0-39%;  b. Carotid US 12/16:  Bilateral ICA 1-39%  . COPD (chronic obstructive pulmonary disease) (HCC)    Dr. Melvyn Novas  . History of cardiac monitoring    Event Monitor 6/16:  NSR, PVCs, no A. fib noted, no significant pauses  . History of echocardiogram    Echo 6/16: Mild LVH, EF 55-60%, normal wall motion, grade 1 diastolic dysfunction, trivial TR, PASP 39 mmHg  . HTN (hypertension)   . Hx of radiation therapy   . Hyperlipidemia   .  Mediastinal lymphadenopathy   . Myocardial infarct [facility]) 06/2004   s/p Cypher DES to RCA  . PVD (peripheral vascular disease) ([place])   . Renal artery stenosis (HCC)    a. hx of L RAS > 60%  . Thyroid nodule   . Tobacco abuse     Family History  Problem Relation Age of Onset  . Diabetes Mother   . Heart failure Father   . Hypertension Father   . Heart attack Father   . Heart attack Other   . Stroke Sister   . Lung cancer Brother        smoked    SOCIAL HISTORY: Social History   Socioeconomic History  . Marital status: Widowed    Spouse name: Not on file  . Number of children: Not on file  . Years of education: Not on file  . Highest education level: Not on file  Occupational History  . Not on file  Social Needs  . Financial resource strain: Not on file  . Food insecurity     Worry: Not on file    Inability: Not on file  . Transportation needs    Medical: Not on file    Non-medical: Not on file  Tobacco Use  . Smoking status: Current Every Day Smoker    Packs/day: 0.50    Years: 75.00    Pack years: 37.50    Types: Cigarettes  . Smokeless tobacco: Never Used  . Tobacco comment: 15 cigarettes a day  Substance and Sexual Activity  . Alcohol use: No    Alcohol/week: 0.0 standard drinks  . Drug use: No  . Sexual activity: Not on file  Lifestyle  . Physical activity    Days per week: Not on file    Minutes per session: Not on file  . Stress: Not on file  Relationships  . Social Herbalist on phone: Not on file    Gets together: Not on file    Attends religious service: Not on file    Active member of club or organization: Not on file    Attends meetings of clubs or organizations: Not on file    Relationship status: Not on file  . Intimate partner violence    Fear of current or ex partner: Not on file    Emotionally abused: Not on file    Physically abused: Not on file    Forced sexual activity: Not on file  Other Topics Concern  . Not on file  Social History Narrative  . Not on file    No Known Allergies  Current Outpatient Medications  Medication Sig Dispense Refill  . aspirin 81 MG tablet Take 81 mg by mouth daily.      [name] atorvastatin (LIPITOR) 40 MG tablet Take 1 tablet (40 mg total) by mouth daily. 90 tablet 3  . bisoprolol (ZEBETA) 5 MG tablet Take 1 tablet (5 mg total) by mouth daily. 30 tablet 11  . cetirizine (ZYRTEC) 10 MG tablet Take 10 mg by mouth daily.      . Multiple Vitamin (ONE-A-DAY MENS PO) Take by mouth daily.    [name] olmesartan (BENICAR) 40 MG tablet TAKE 1/2 TABLET BY MOUTH ONCE A DAY 15 tablet 3  . OXYGEN DME- [facility]  2lpm with sleep    . TRELEGY ELLIPTA 100-62.5-25 MCG/INH AEPB TAKE 1 PUFF BY MOUTH EVERY DAY 60 each 11  . amLODipine (NORVASC) 5 MG tablet Take 1 tablet (5 mg total) by mouth  daily. 180 tablet 2    No current facility-administered medications for this visit.     REVIEW OF SYSTEMS:  [X]  denotes positive finding, [ ]  denotes negative finding Cardiac  Comments:  Chest pain or chest pressure:    Shortness of breath upon exertion: x   Short of breath when lying flat:    Irregular heart rhythm:        Vascular    Pain in calf, thigh, or hip brought on by ambulation:    Pain in feet at night that wakes you up from your sleep:     Blood clot in your veins:    Leg swelling:         Pulmonary    Oxygen at home: x   Productive cough:  x   Wheezing:         Neurologic    Sudden weakness in arms or legs:     Sudden numbness in arms or legs:     Sudden onset of difficulty speaking or slurred speech:    Temporary loss of vision in one eye:     Problems with dizziness:         Gastrointestinal    Blood in stool:     Vomited blood:         Genitourinary    Burning when urinating:     Blood in urine:        Psychiatric    Major depression:         Hematologic    Bleeding problems:    Problems with blood clotting too easily:        Skin    Rashes or ulcers:        Constitutional    Fever or chills:     PHYSICAL EXAM:   Vitals:   05/15/19 1259  BP: 138/80  Pulse: 71  Resp: 20  Temp: (!) 97.3 F (36.3 C)  SpO2: (!) 88%  Weight: 107 lb 8 oz (48.8 kg)  Height: 5' 3.25" (1.607 m)    GENERAL: The patient is a well-nourished male, with mild shortness of breath at rest.. The vital signs are documented above. CARDIAC: There is a regular rate and rhythm.  VASCULAR: I do not detect carotid bruits. On the right side he has a palpable femoral, popliteal, and dorsalis pedis pulse. On the left side I cannot palpate a femoral pulse or pedal pulses. He has no significant lower extremity swelling. PULMONARY: There is good air exchange bilaterally without wheezing or rales. ABDOMEN: Soft and non-tender with normal pitched bowel sounds.  His aneurysm is palpable and  nontender. MUSCULOSKELETAL: There are no major deformities or cyanosis. NEUROLOGIC: No focal weakness or paresthesias are detected. SKIN: There are no ulcers or rashes noted. PSYCHIATRIC: The patient has a normal affect.  DATA:    DUPLEX ABDOMINAL AORTIC ANEURYSM: I have reviewed his duplex of the abdominal aortic aneurysm that was done on 03/14/19.  The maximum diameter of his infrarenal aorta was 5.4 cm.  The right common iliac artery measures 1.4 cm in maximum diameter.  The left common iliac artery measures 1.5 cm in maximum diameter.

## 2019-07-17 ENCOUNTER — Ambulatory Visit: Payer: Medicare Other | Admitting: Internal Medicine

## 2019-07-19 ENCOUNTER — Telehealth (INDEPENDENT_AMBULATORY_CARE_PROVIDER_SITE_OTHER): Payer: Medicare Other | Admitting: Internal Medicine

## 2019-07-19 ENCOUNTER — Encounter: Payer: Self-pay | Admitting: Internal Medicine

## 2019-07-19 DIAGNOSIS — J9611 Chronic respiratory failure with hypoxia: Secondary | ICD-10-CM

## 2019-07-19 DIAGNOSIS — F17210 Nicotine dependence, cigarettes, uncomplicated: Secondary | ICD-10-CM | POA: Diagnosis not present

## 2019-07-19 DIAGNOSIS — J449 Chronic obstructive pulmonary disease, unspecified: Secondary | ICD-10-CM | POA: Diagnosis not present

## 2019-07-19 NOTE — Assessment & Plan Note (Signed)
rec 24h 2019   02 sats 83% at rest 09/25/2017 rec 2lpm 24/7  - 10/26/2017 sats 90% at rest RA on trelegy so rec 2lpm hs and POC daytime titrate to keep sats > 90%  - walking titration required 5 lpm POC to keep sats > 90% on 10/30/17  As of 07/19/2019 using 2lpm hs only and not typically using with ex  rec Make sure you check your oxygen saturations at highest level of activity to be sure it stays over 90% and adjust upward to maintain this level if needed but remember to turn it back to previous settings when you stop (to conserve your supply).

## 2019-07-19 NOTE — Progress Notes (Signed)
Subjective:    Patient ID: Derek Jordan, male    DOB: 04/14/1933,     MRN: [id number]    Brief patient profile:  63 yowm active smoker widower lives in [place] with   PCP Cyndi Bender  referred by to pulmonary eval 01/22/2015 [name] for copd eval and proved to have GOLD IV severity 03/13/15 with symptoms than improved off acei and coreg to GOLD II by 10/26/17    History of Present Illness  01/22/2015 1st [facility] office visit/ Laneice Meneely   Chief Complaint  Patient presents with  . Pulmonary Consult    Referred by [name], PA for eval of COPD. Pt c/o cough and SOB- progressively worse over the past year. He is SOB sometimes just sitting and with minimal exertion. Cough is prod with large amounts of sputum "sometimes clear sometimes dark".   s/p L lower lowb partial lobectomy  2008 : Adenoca  pT2, pN2, pMX  s/p RT and chemo and no doe until around 2015 on acei with indolent onset  Progressively worse  sob > slow and flat ok but  Sometimes can't even walk at a nl pace x 10 ft but varies a lot s pattern/ sometimes sob at rest/ no better with inhalers in past / assoc with severe coughing fits more day than night. rec Prednisone 10 mg take  4 each am x 2 days,   2 each am x 2 days,  1 each am x 2 days and stop  Stop lisinopril Start benicar 20 one daily  The key is to stop smoking completely before smoking completely stops you!     03/13/2015 f/u ov/Raguel Kosloski re: copd GOLD IV no maint rx/ no saba  Chief Complaint  Patient presents with  . Follow-up    PFT done today. Pt states his breathing is much improved.     Not limited by breathing from desired activities  But very inactive >change coreg to bisoprolol       09/25/2017   extended ov/Aaminah Forrester re: re- establish re GOLD IV copd / now hypoxemic  Chief Complaint  Patient presents with  . Follow-up    went to see cards on 09/14/17- sats low and o2 was ordered but insurance will not cover due to "not trying other methods for COPD".   He states has good days and bad days with breathing.    gradually downhill x 4 months esp noct sob  MMRC2 = can't walk a nl pace on a flat grade s sob but does fine slow and flat eg [employer] s 02  Sleeps horizontal with sob when lies down x up to 2h  eventually able to sleep x 4 m with minimal am rattling > minimal mucoid sputum  rec Start Trelegy one click each am  Start 2lpm 24/7 and no smoking when 02 is running      10/26/2017  f/u ov/Rebeckah Masih re: COPD II criteria/ 02 2lpm hs not using daytime  Chief Complaint  Patient presents with  . Follow-up    PFT's done today. Breathing is better and he is coughing much less.    Dyspnea:  MMRC2 = can't walk a nl pace on a flat grade s sob but does fine slow and flat  Cough: am rattling  Sleep: ok 2lpm flat SABA use:   Rec:  No change Trelegy The key is to stop smoking completely before smoking completely stops you!     01/26/2018  f/u ov/Ana Woodroof re:  COPD II criteria / 02 2lpm hs / trelegy  Chief Complaint  Patient presents with  . Follow-up    Breathing is doing well.   Dyspnea:  MMRC2 = can't walk a nl pace on a flat grade s sob but does fine slow and flat  Cough: overall less/ worse if gets out in heat / humidity/ clear  SABA use: none 02: 2lpm hs and has portable tank doesn't use it   rec Goal with 02 is to keep the sats above 90% so walk slower or wear your portable system and adjust it to keep it over 90%  The key is to stop smoking completely before smoking completely stops you!    07/16/2018  f/u ov/Johnathon Olden re: copd II/ trelegy maint rx  Chief Complaint  Patient presents with  . Follow-up    Breathing is "doing fine".  He does not have a rescue inhaler.   Dyspnea:  MMRC2 = can't walk a nl pace on a flat grade s sob but does fine slow and flat Cough: min mucoid /worse thing in am Sleeping: L side down / one pillow / 4 in block elevaation  SABA use: none 02: 2lpm hs  And prn otherwise   rec No change medications The  key is to stop smoking completely before smoking completely stops you!    Virtual Visit via Telephone Note 01/15/2019  Re COPD  II / trelegy / 02 2lpm hs History of Present Illness: Dyspnea:  Still MMRC2 Cough: in am x one hour, worse more he smokes>mucoid  Sleeping: ok on L side / bed is flat SABA use: none  02: 2lpm  Observations/Objective: Speaking in full sentences / mild rattling cough  rec No change in medications The key is to stop smoking completely before smoking completely stops you!   Virtual Visit via Video Note I connected with LAMAR METER on 07/19/19 at  1:30 PM EST by a video enabled telemedicine application and verified that I am speaking with the correct person using two identifiers.  Location: Patient: at daughter's kitchen as she has the computer Provider: office    I discussed the limitations of evaluation and management by telemedicine and the availability of in person appointments. The patient expressed understanding and agreed to proceed.  History of Present Illness:  Copd II/ trelegy /02 2lpm HS/ still smoking  Dyspnea:  MMRC2 = can't walk a nl pace on a flat grade s sob but does fine slow and flat eg  Cough:  Minimal in am / no production Sleeping: on side / flat bed/ one pillow SABA use:  None  02: 2lpm hs   sats run ok after walking but not checking during walk     Observations/Objective: Looking good at rest sitting / somewhat congested cough   Assessment and Plan: See a/p   Follow Up Instructions:  No change in rx > f/u 6 months    I discussed the assessment and treatment plan with the patient. The patient was provided an opportunity to ask questions and all were answered. The patient agreed with the plan and demonstrated an understanding of the instructions.   The patient was advised to call back or seek an in-person evaluation if the symptoms worsen or if the condition fails to improve as anticipated.  I provided 25  minutes of  non-face-to-face time during this encounter.   Christinia Gully, MD

## 2019-07-19 NOTE — Assessment & Plan Note (Signed)
Active smoker  - trial off acei 01/23/2015 > improved  - PFTs 03/13/2015  FEV1  0.63 (28%) ratio 44 p 13% better from saba and dlco 24 corrects to 40 for alv vol - 03/14/2015 trial off coreg and on bisoprolol > improved  - 09/25/2017  After extensive coaching inhaler device  effectiveness =    90% with dpi/ elipta so rec trial of trelegy and f/u pfts in 4 weeks  - Allergy profile 09/25/17  >  Eos 0.1 /  IgE  102 RAST neg - PFT's  10/26/2017  FEV1 1.21 (56 % ) ratio 53  p 14  % improvement from saba p trelegy prior to study with DLCO  34/33 % corrects to 51  % for alv volume     Group D in terms of symptom/risk and laba/lama/ICS  therefore appropriate rx at this point >>>  Continue trelegy daily   Pt informed of the seriousness of COVID 19 infection as a direct risk to their health  and safey and to those of their loved ones and should continue to wear facemask in public and minimize exposure to public locations but especially avoid any area or activity where non-close contacts are not observing distancing or wearing an appropriate face mask > rec vaccine as soon as available  >>>  6 m f/u, call sooner if needed

## 2019-07-19 NOTE — Patient Instructions (Addendum)
No change in medications  Make sure you check your oxygen saturations at highest level of activity to be sure it stays over 90% and adjust upward to maintain this level if needed but remember to turn it back to previous settings when you stop (to conserve your supply).   The key is to stop smoking completely before smoking completely stops you!  Please schedule a follow up visit in 6 months but call sooner if needed

## 2019-07-19 NOTE — Assessment & Plan Note (Signed)
Counseled re importance of smoking cessation but did not meet time criteria for separate billing   °

## 2019-10-01 ENCOUNTER — Ambulatory Visit: Payer: Medicare Other | Admitting: Interventional Cardiology

## 2019-10-21 NOTE — Progress Notes (Signed)
Cardiology Office Note   Date:  10/22/2019   ID:  Derek, Jordan 1932-12-19, MRN [id number]  PCP:  Cyndi Bender, PA-C    No chief complaint on file.  CAD/old MI  Wt Readings from Last 3 Encounters:  10/22/19 108 lb (49 kg)  05/15/19 107 lb 8 oz (48.8 kg)  10/01/18 115 lb 3.2 oz (52.3 kg)       History of Present Illness: Derek Jordan is a 86 y.o. male  with a history of single-vessel coronary artery disease, status post acute inferior wall myocardial infarction treated with Cypher drug-eluting stent to the mid right coronary artery in 2005, 3.5 x 28 mm. His LV function was normal. There was mild noncritical disease elsewhere. He also has a history of lung CA (s/p resection/chemo/XRT), hyperlipidemia, severe peripheral vascular disease with a small infrarenal abdominal aneurysm, carotid stenosis and greater than 60% left renal artery stenosis by ultrasound.  His sx was syncope with the MI.He was moving a load of hay.  He has smoked for many years. He has had home O2 in the past. As of 2020, he is using oxygen at night.    An inhaler , Trelegy has helped his breathing, but still has chronic dyspnea on exertion.  Denies : Chest pain. Dizziness. Leg edema. Nitroglycerin use. Orthopnea. Palpitations. Paroxysmal nocturnal dyspnea. Syncope.  Aggravated by his little brother.  He feels stress.  Using oxygen at home.       Past Medical History:  Diagnosis Date  . AAA (abdominal aortic aneurysm) ([facility])    a. Abd [place] 6/16:  3.9 x 4.4 cm with some intraluminal thrombus, chronic L EIA occlusion >> FU 6 mos  . Adenocarcinoma ([facility])    Lung CA s/p resection, chemoTx, XR Tx  . CAD (coronary artery disease)    a. LHC 11/05 at time of MI:  pLAD 40, MOM 40, mRCA 99, EF 60% >> 3.5 x 28 mm Cypher DES to mRCA;  b. Myoview 6/16:  normal perfusion, EF 57%  . Carotid artery stenosis    a. Carotid US 5/12:  bilat 0-39%;  b. Carotid US 12/16:  Bilateral ICA 1-39%  . COPD  (chronic obstructive pulmonary disease) ([facility])    Dr. Melvyn Novas  . History of cardiac monitoring    Event Monitor 6/16:  NSR, PVCs, no A. fib noted, no significant pauses  . History of echocardiogram    Echo 6/16: Mild LVH, EF 55-60%, normal wall motion, grade 1 diastolic dysfunction, trivial TR, PASP 39 mmHg  . HTN (hypertension)   . Hx of radiation therapy   . Hyperlipidemia   . Mediastinal lymphadenopathy   . Myocardial infarct [facility]) 06/2004   s/p Cypher DES to RCA  . PVD (peripheral vascular disease) ([facility])   . Renal artery stenosis ([facility])    a. hx of L RAS > 60%  . Thyroid nodule   . Tobacco abuse     Past Surgical History:  Procedure Laterality Date  . BIOPSY THYROID  03-13-2007  . CORONARY ARTERY DISEASE, S/P PTCA (06-09-2004)    . THORACOSCOPIC SURGICAL,WEDGE RESECTION (05-01-2007)    . TONSILLECTOMY       Current Outpatient Medications  Medication Sig Dispense Refill  . amLODipine (NORVASC) 5 MG tablet Take 1 tablet (5 mg total) by mouth daily. 180 tablet 2  . aspirin 81 MG tablet Take 81 mg by mouth daily.      [name] atorvastatin (LIPITOR) 40 MG tablet Take 1 tablet (40  mg total) by mouth daily. 90 tablet 3  . bisoprolol (ZEBETA) 5 MG tablet Take 1 tablet (5 mg total) by mouth daily. 30 tablet 11  . cetirizine (ZYRTEC) 10 MG tablet Take 10 mg by mouth daily.      . Multiple Vitamin (ONE-A-DAY MENS PO) Take by mouth daily.    [name] olmesartan (BENICAR) 40 MG tablet TAKE 1/2 TABLET BY MOUTH ONCE A DAY 15 tablet 3  . OXYGEN DME- [facility]  2lpm with sleep    . TRELEGY ELLIPTA 100-62.5-25 MCG/INH AEPB TAKE 1 PUFF BY MOUTH EVERY DAY 60 each 11   No current facility-administered medications for this visit.    Allergies:   Patient has no known allergies.    Social History:  The patient  reports that he has been smoking cigarettes. He has a 37.50 pack-year smoking history. He has never used smokeless tobacco. He reports that he does not drink alcohol or use drugs.   Family History:  The  patient's family history includes Diabetes in his mother; Heart attack in his father and another family member; Heart failure in his father; Hypertension in his father; Lung cancer in his brother; Stroke in his sister.    ROS:  Please see the history of present illness.   Otherwise, review of systems are positive for chronic shortness of breath.   All other systems are reviewed and negative.    PHYSICAL EXAM: VS:  BP 136/76   Pulse 83   Ht 5\' 3"  (1.6 m)   Wt 108 lb (49 kg)   SpO2 (!) 88%   BMI 19.13 kg/m  , BMI Body mass index is 19.13 kg/m. GEN: Well nourished, well developed, baseline Shortness of breath; frail HEENT: normal  Neck: no JVD, carotid bruits, or masses Cardiac: *RRR; no murmurs, rubs, or gallops,no edema  Respiratory:  clear to auscultation bilaterally, normal work of breathing GI: soft, nontender, nondistended, + BS MS: no deformity or atrophy  Skin: warm and dry, no rash Neuro:  Strength and sensation are intact Psych: euthymic mood, full affect   EKG:   The ekg ordered today demonstrates NSR, nonspecific ST segment changes   Recent Labs: No results found for requested labs within last 8760 hours.   Lipid Panel    Component Value Date/Time   CHOL 111 12/22/2014 1242   TRIG 41.0 12/22/2014 1242   HDL 48.40 12/22/2014 1242   CHOLHDL 2 12/22/2014 1242   VLDL 8.2 12/22/2014 1242   LDLCALC 54 12/22/2014 1242     Other studies Reviewed: Additional studies/ records that were reviewed today with results demonstrating: labs from PMD reviewed- TC 105, HDL 49. Dr. Nicole Cella note reviewed, apparently CTA recommended but patient feels that no further w/u is needed.      ASSESSMENT AND PLAN:  1. CAD/old MI: Status post RCA stent at the time of inferior MI.  Continue aggressive secondary prevention.  Chronic SHOB from lung disease.   2. Hypertension: The current medical regimen is effective;  continue present plan and medications. 3. Tobacco abuse.  He has not  been interested in stopping smoking over the past several visits.  Still smoking about 1/2 ppd.  4. AAA: Measured at 5.4 cm in August 2020.   He will now have follow-up CT with vascular surgery.  No intervention thus far.  Patient feels that it will not ever need to be fixed.  Will cc: Dr. Scot Dock.    Current medicines are reviewed at length with the patient today.  The patient concerns regarding his medicines were addressed.  The following changes have been made:  No change  Labs/ tests ordered today include:  No orders of the defined types were placed in this encounter.   Recommend 150 minutes/week of aerobic exercise Low fat, low carb, high fiber diet recommended  Disposition:   FU in 1 year   Signed, Larae Grooms, MD  10/22/2019 11:38 AM    [facility] [address], [place], [place]  [address] Phone: [phone]; Fax: [phone]

## 2019-10-22 ENCOUNTER — Ambulatory Visit (HOSPITAL_COMMUNITY)
Admission: RE | Admit: 2019-10-22 | Discharge: 2019-10-22 | Disposition: A | Discharge status: Home / Self Care | Payer: Medicare Other | Source: Ambulatory Visit | Attending: Cardiovascular Disease | Admitting: Cardiovascular Disease

## 2019-10-22 ENCOUNTER — Other Ambulatory Visit (HOSPITAL_COMMUNITY): Payer: Self-pay | Admitting: Interventional Cardiology

## 2019-10-22 ENCOUNTER — Other Ambulatory Visit: Payer: Self-pay

## 2019-10-22 ENCOUNTER — Ambulatory Visit (INDEPENDENT_AMBULATORY_CARE_PROVIDER_SITE_OTHER): Payer: Medicare Other | Admitting: Interventional Cardiology

## 2019-10-22 ENCOUNTER — Encounter: Payer: Self-pay | Admitting: Interventional Cardiology

## 2019-10-22 VITALS — BP 136/76 | HR 83 | Ht 63.0 in | Wt 108.0 lb

## 2019-10-22 DIAGNOSIS — I10 Essential (primary) hypertension: Secondary | ICD-10-CM | POA: Diagnosis not present

## 2019-10-22 DIAGNOSIS — I714 Abdominal aortic aneurysm, without rupture, unspecified: Secondary | ICD-10-CM

## 2019-10-22 DIAGNOSIS — Z720 Tobacco use: Secondary | ICD-10-CM | POA: Diagnosis not present

## 2019-10-22 DIAGNOSIS — I252 Old myocardial infarction: Secondary | ICD-10-CM

## 2019-10-22 DIAGNOSIS — I25118 Atherosclerotic heart disease of native coronary artery with other forms of angina pectoris: Secondary | ICD-10-CM | POA: Diagnosis not present

## 2019-10-22 NOTE — Patient Instructions (Signed)
Medication Instructions:  Your physician recommends that you continue on your current medications as directed. Please refer to the Current Medication list given to you today.  *If you need a refill on your cardiac medications before your next appointment, please call your pharmacy*   Lab Work: None ordered  If you have labs (blood work) drawn today and your tests are completely normal, you will receive your results only by: [name] MyChart Message (if you have MyChart) OR . A paper copy in the mail If you have any lab test that is abnormal or we need to change your treatment, we will call you to review the results.   Testing/Procedures: None ordered   Follow-Up: At [facility], you and your health needs are our priority.  As part of our continuing mission to provide you with exceptional heart care, we have created designated Provider Care Teams.  These Care Teams include your primary Cardiologist (physician) and Advanced Practice Providers (APPs -  Physician Assistants and Nurse Practitioners) who all work together to provide you with the care you need, when you need it.  We recommend signing up for the patient portal called "MyChart".  Sign up information is provided on this After Visit Summary.  MyChart is used to connect with patients for Virtual Visits (Telemedicine).  Patients are able to view lab/test results, encounter notes, upcoming appointments, etc.  Non-urgent messages can be sent to your provider as well.   To learn more about what you can do with MyChart, go to [email or web address].    Your next appointment:   12 month(s)  The format for your next appointment:   In Person  Provider:   You may see Larae Grooms, MD or one of the following Advanced Practice Providers on your designated Care Team:    Melina Copa, PA-C  Ermalinda Barrios, PA-C    Other Instructions

## 2019-10-24 ENCOUNTER — Telehealth: Payer: Self-pay

## 2019-10-24 NOTE — Telephone Encounter (Signed)
-----   Message from Jettie Booze, MD sent at 10/24/2019 11:14 AM EDT ----- AAA size unchanged.  Keep f/u with Dr. Scot Dock.

## 2019-10-24 NOTE — Telephone Encounter (Signed)
The patient's daughter has been notified of the [place] result and verbalized understanding.  All questions (if any) were answered. Frederik Schmidt, RN 10/24/2019 3:29 PM

## 2019-11-11 ENCOUNTER — Other Ambulatory Visit: Payer: Medicare Other

## 2019-11-13 ENCOUNTER — Ambulatory Visit: Payer: Medicare Other | Admitting: Vascular Surgery

## 2019-11-18 ENCOUNTER — Other Ambulatory Visit: Payer: Medicare Other

## 2019-12-25 ENCOUNTER — Ambulatory Visit (INDEPENDENT_AMBULATORY_CARE_PROVIDER_SITE_OTHER): Payer: Medicare Other | Admitting: Vascular Surgery

## 2019-12-25 ENCOUNTER — Other Ambulatory Visit: Payer: Self-pay

## 2019-12-25 ENCOUNTER — Encounter: Payer: Self-pay | Admitting: Vascular Surgery

## 2019-12-25 ENCOUNTER — Ambulatory Visit
Admission: RE | Admit: 2019-12-25 | Discharge: 2019-12-25 | Disposition: A | Discharge status: Home / Self Care | Payer: Medicare Other | Source: Ambulatory Visit | Attending: Vascular Surgery | Admitting: Vascular Surgery

## 2019-12-25 VITALS — BP 119/66 | HR 81 | Temp 97.8°F | Resp 20 | Ht 63.0 in | Wt 107.0 lb

## 2019-12-25 DIAGNOSIS — I714 Abdominal aortic aneurysm, without rupture, unspecified: Secondary | ICD-10-CM

## 2019-12-25 MED ORDER — IOPAMIDOL (ISOVUE-370) INJECTION 76%
75.0000 mL | Freq: Once | INTRAVENOUS | Status: AC | PRN
  Administered 2019-12-25: 75 mL via INTRAVENOUS

## 2019-12-25 NOTE — Progress Notes (Signed)
REASON FOR VISIT:   Follow-up of abdominal aortic aneurysm  MEDICAL ISSUES:   5.9 CM JUXTARENAL ABDOMINAL AORTIC ANEURYSM: This patient has a 5.9 cm juxtarenal abdominal aortic aneurysm by CT angiogram.  The aneurysm extends up to the level of the renal arteries and above that the celiac axis and superior mesenteric artery takeoff very close to the renal arteries.  Thus there is no place to clamp below the mesenteric's.  Above the celiac axis the aorta is significantly calcified.  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  He is clearly not a candidate for endovascular repair of his aneurysm nor do I think he would be a good candidate for a fenestrated graft given the external iliac artery occlusion on the left.  Regardless he is short of breath at rest.  He is markedly debilitated and has a history of adenocarcinoma of the lung.  I do not think that he is a candidate for open repair of his aneurysm.  He understands that he is not a candidate for open repair.  We have again discussed the importance of tobacco cessation.  We could consider referring him to [facility] for a thoracoabdominal approach to his aneurysm however I do not think he would tolerate this given his markedly debilitated state and severe pulmonary disease.  I have ordered a follow-up ultrasound in 6 months and I will see him back at that time.   HPI:   Derek Jordan is a pleasant 86 y.o. male who I last saw on 05/15/2019.  At that time he had a 5.4 cm infrarenal abdominal aortic aneurysm.  I explained that had a normal risk patient we would consider elective repair at 5.5 cm.  However, he is clearly not a normal risk.  He has a history of coronary artery disease, COPD, he is on home O2, and has a history of a lung malignancy.  He smokes three quarters of a pack per day and is been smoking for 75 years.  I explained that given that he was high risk we would likely consider waiting until the aneurysm enlarges  significantly before considering repair.  I did recommend a follow-up CT in 6 months to help determine what his options for repair would be if this did enlarge.  We also had a long discussion about the importance of tobacco cessation.  Since I saw him last he denies any abdominal pain or back pain.  He continues to smoke a half a pack per day and has been smoking for 75 years.  He is short of breath at rest.  He denies any claudication rest pain or nonhealing ulcers.  Past Medical History:  Diagnosis Date  . AAA (abdominal aortic aneurysm) ([place])    a. Abd [place] 6/16:  3.9 x 4.4 cm with some intraluminal thrombus, chronic L EIA occlusion >> FU 6 mos  . Adenocarcinoma ([place])    Lung CA s/p resection, chemoTx, XR Tx  . CAD (coronary artery disease)    a. LHC 11/05 at time of MI:  pLAD 40, MOM 40, mRCA 99, EF 60% >> 3.5 x 28 mm Cypher DES to mRCA;  b. Myoview 6/16:  normal perfusion, EF 57%  . Carotid artery stenosis    a. Carotid US 5/12:  bilat 0-39%;  b. Carotid US 12/16:  Bilateral ICA 1-39%  . COPD (chronic obstructive pulmonary disease) ([facility])    Dr. Melvyn Novas  . History of cardiac monitoring    Event  Monitor 6/16:  NSR, PVCs, no A. fib noted, no significant pauses  . History of echocardiogram    Echo 6/16: Mild LVH, EF 55-60%, normal wall motion, grade 1 diastolic dysfunction, trivial TR, PASP 39 mmHg  . HTN (hypertension)   . Hx of radiation therapy   . Hyperlipidemia   . Mediastinal lymphadenopathy   . Myocardial infarct [facility]) 06/2004   s/p Cypher DES to RCA  . PVD (peripheral vascular disease) ([place])   . Renal artery stenosis ([facility])    a. hx of L RAS > 60%  . Thyroid nodule   . Tobacco abuse     Family History  Problem Relation Age of Onset  . Diabetes Mother   . Heart failure Father   . Hypertension Father   . Heart attack Father   . Heart attack Other   . Stroke Sister   . Lung cancer Brother        smoked    SOCIAL HISTORY: Social History   Tobacco Use  . Smoking status:  Current Every Day Smoker    Packs/day: 0.50    Years: 75.00    Pack years: 37.50    Types: Cigarettes  . Smokeless tobacco: Never Used  . Tobacco comment: 15 cigarettes a day  Substance Use Topics  . Alcohol use: No    Alcohol/week: 0.0 standard drinks    No Known Allergies  Current Outpatient Medications  Medication Sig Dispense Refill  . aspirin 81 MG tablet Take 81 mg by mouth daily.      [name] atorvastatin (LIPITOR) 40 MG tablet Take 1 tablet (40 mg total) by mouth daily. 90 tablet 3  . bisoprolol (ZEBETA) 5 MG tablet Take 1 tablet (5 mg total) by mouth daily. 30 tablet 11  . cetirizine (ZYRTEC) 10 MG tablet Take 10 mg by mouth daily.      . Multiple Vitamin (ONE-A-DAY MENS PO) Take by mouth daily.    [name] olmesartan (BENICAR) 40 MG tablet TAKE 1/2 TABLET BY MOUTH ONCE A DAY 15 tablet 3  . OXYGEN DME- [facility]  2lpm with sleep    . TRELEGY ELLIPTA 100-62.5-25 MCG/INH AEPB TAKE 1 PUFF BY MOUTH EVERY DAY 60 each 11  . amLODipine (NORVASC) 5 MG tablet Take 1 tablet (5 mg total) by mouth daily. 180 tablet 2   No current facility-administered medications for this visit.    REVIEW OF SYSTEMS:  [X]  denotes positive finding, [ ]  denotes negative finding Cardiac  Comments:  Chest pain or chest pressure:    Shortness of breath upon exertion: x   Short of breath when lying flat: x   Irregular heart rhythm:        Vascular    Pain in calf, thigh, or hip brought on by ambulation:    Pain in feet at night that wakes you up from your sleep:     Blood clot in your veins:    Leg swelling:         Pulmonary    Oxygen at home: x   Productive cough:     Wheezing:  x       Neurologic    Sudden weakness in arms or legs:     Sudden numbness in arms or legs:     Sudden onset of difficulty speaking or slurred speech:    Temporary loss of vision in one eye:     Problems with dizziness:         Gastrointestinal    Blood  in stool:     Vomited blood:         Genitourinary    Burning when  urinating:     Blood in urine:        Psychiatric    Major depression:         Hematologic    Bleeding problems:    Problems with blood clotting too easily:        Skin    Rashes or ulcers:        Constitutional    Fever or chills:     PHYSICAL EXAM:   Vitals:   12/25/19 1414  BP: 119/66  Pulse: 81  Resp: 20  Temp: 97.8 F (36.6 C)  SpO2: (!) 88%  Weight: 107 lb (48.5 kg)  Height: 5\' 3"  (1.6 m)    GENERAL: The patient is a well-nourished male, in no acute distress. The vital signs are documented above. CARDIAC: There is a regular rate and rhythm.  VASCULAR: I do not detect carotid bruits. On the right side he has a palpable femoral pulse and a diminished but palpable dorsalis pedis pulse. On the left side I cannot palpate a femoral pulse or pedal pulses. PULMONARY: There is good air exchange bilaterally without wheezing or rales. ABDOMEN: Soft and non-tender with normal pitched bowel sounds.  His aneurysm is palpable and nontender. MUSCULOSKELETAL: There are no major deformities or cyanosis. NEUROLOGIC: No focal weakness or paresthesias are detected. SKIN: There are no ulcers or rashes noted. PSYCHIATRIC: The patient has a normal affect.  DATA:    CT ANGIOGRAM ABDOMEN PELVIS: The CT scan has not yet been read by radiology but by my interpretation the patient has a juxtarenal 5.9 cm abdominal aortic aneurysm.  His left external iliac artery is occluded.  He has diffuse calcific disease of his iliacs.  The celiac axis and superior mesenteric artery, very close to each other and just below that is the takeoff of the renals.  The aneurysm extends up to the renals.  The aorta above the celiac axis is markedly calcified.  LABS: I do not have any recent labs.  Back in 2019 his creatinine was 1.2 with a GFR of 61.  Deitra Mayo [facility] [phone]

## 2019-12-26 ENCOUNTER — Other Ambulatory Visit: Payer: Self-pay | Admitting: *Deleted

## 2019-12-26 DIAGNOSIS — I714 Abdominal aortic aneurysm, without rupture, unspecified: Secondary | ICD-10-CM

## 2020-02-14 ENCOUNTER — Other Ambulatory Visit: Payer: Self-pay | Admitting: Internal Medicine

## 2020-02-26 ENCOUNTER — Other Ambulatory Visit: Payer: Self-pay | Admitting: Internal Medicine

## 2020-06-27 ENCOUNTER — Other Ambulatory Visit: Payer: Self-pay | Admitting: Internal Medicine

## 2020-07-07 ENCOUNTER — Other Ambulatory Visit: Payer: Self-pay

## 2020-07-07 ENCOUNTER — Encounter: Payer: Self-pay | Admitting: Internal Medicine

## 2020-07-07 ENCOUNTER — Ambulatory Visit (INDEPENDENT_AMBULATORY_CARE_PROVIDER_SITE_OTHER): Payer: Medicare Other | Admitting: Internal Medicine

## 2020-07-07 ENCOUNTER — Telehealth: Payer: Self-pay | Admitting: Internal Medicine

## 2020-07-07 ENCOUNTER — Ambulatory Visit (INDEPENDENT_AMBULATORY_CARE_PROVIDER_SITE_OTHER): Payer: Medicare Other

## 2020-07-07 DIAGNOSIS — R918 Other nonspecific abnormal finding of lung field: Secondary | ICD-10-CM | POA: Diagnosis not present

## 2020-07-07 DIAGNOSIS — F17210 Nicotine dependence, cigarettes, uncomplicated: Secondary | ICD-10-CM | POA: Diagnosis not present

## 2020-07-07 DIAGNOSIS — J449 Chronic obstructive pulmonary disease, unspecified: Secondary | ICD-10-CM

## 2020-07-07 DIAGNOSIS — J9611 Chronic respiratory failure with hypoxia: Secondary | ICD-10-CM | POA: Diagnosis not present

## 2020-07-07 DIAGNOSIS — J9612 Chronic respiratory failure with hypercapnia: Secondary | ICD-10-CM

## 2020-07-07 LAB — CBC WITH DIFFERENTIAL/PLATELET
Basophils Absolute: 0 10*3/uL (ref 0.0–0.1)
Basophils Relative: 0.5 % (ref 0.0–3.0)
Eosinophils Absolute: 0.1 10*3/uL (ref 0.0–0.7)
Eosinophils Relative: 1.4 % (ref 0.0–5.0)
HCT: 45.4 % (ref 39.0–52.0)
Hemoglobin: 15 g/dL (ref 13.0–17.0)
Lymphocytes Relative: 21.4 % (ref 12.0–46.0)
Lymphs Abs: 2 10*3/uL (ref 0.7–4.0)
MCHC: 33 g/dL (ref 30.0–36.0)
MCV: 96.1 fl (ref 78.0–100.0)
Monocytes Absolute: 0.6 10*3/uL (ref 0.1–1.0)
Monocytes Relative: 6.5 % (ref 3.0–12.0)
Neutro Abs: 6.4 10*3/uL (ref 1.4–7.7)
Neutrophils Relative %: 70.2 % (ref 43.0–77.0)
Platelets: 263 10*3/uL (ref 150.0–400.0)
RBC: 4.72 Mil/uL (ref 4.22–5.81)
RDW: 15.9 % — ABNORMAL HIGH (ref 11.5–15.5)
WBC: 9.1 10*3/uL (ref 4.0–10.5)

## 2020-07-07 LAB — BASIC METABOLIC PANEL
BUN: 8 mg/dL (ref 6–23)
CO2: 33 mEq/L — ABNORMAL HIGH (ref 19–32)
Calcium: 9.4 mg/dL (ref 8.4–10.5)
Chloride: 97 mEq/L (ref 96–112)
Creatinine, Ser: 1.02 mg/dL (ref 0.40–1.50)
GFR: 66.99 mL/min (ref >60.00)
Glucose, Bld: 96 mg/dL (ref 70–99)
Potassium: 3.8 mEq/L (ref 3.5–5.1)
Sodium: 138 mEq/L (ref 135–145)

## 2020-07-07 MED ORDER — PROAIR RESPICLICK 108 (90 BASE) MCG/ACT IN AEPB: 1.0000 | INHALATION_SPRAY | RESPIRATORY_TRACT | 11 refills | Status: AC | PRN

## 2020-07-07 MED ORDER — PREDNISONE 10 MG PO TABS: ORAL_TABLET | ORAL | 0 refills | Status: DC

## 2020-07-07 MED ORDER — LEVOFLOXACIN 500 MG PO TABS: 500.0000 mg | ORAL_TABLET | Freq: Every day | ORAL | 0 refills | Status: AC

## 2020-07-07 NOTE — Patient Instructions (Addendum)
Plan A = Automatic = Always=    Trelegy  Plan B = Backup (to supplement plan A, not to replace it) Only use your albuterol inhaler as a rescue medication to be used if you can't catch your breath by resting or doing a relaxed purse lip breathing pattern.  - The less you use it, the better it will work when you need it. - Ok to use the inhaler up to 2 puffs  every 4 hours if you must but call for appointment if use goes up over your usual need - Don't leave home without it !!  (think of it like the spare tire for your car)   The key is to stop smoking completely before smoking completely stops you!  Make sure you check your oxygen saturations at highest level of activity to be sure it stays over 90% and adjust  02 flow upward to maintain this level if needed but remember to turn it back to previous settings when you stop (to conserve your supply).      For congestion > mucinex or mucinex dm up to 1200 mg every 12 hours as needed  Prednisone 10 mg take  4 each am x 2 days,   2 each am x 2 days,  1 each am x 2 days and stop    Please remember to go to the lab and x-ray department    for your tests - we will call you with the results when they are available.       Please schedule a follow up visit in 3 months but call sooner if needed - bring your inhalers with you.

## 2020-07-07 NOTE — Progress Notes (Addendum)
Subjective:    Patient ID: Derek Jordan, male    DOB: 07-29-1933,     MRN: [id number]    Brief patient profile:  89  yowm active smoker widower lives in [place]  /  PCP is Cyndi Bender  And  referred by to pulmonary eval 01/22/2015 [name] for copd eval and proved to have GOLD IV severity 03/13/15 with symptoms than improved off acei and coreg to GOLD II by 10/26/17    History of Present Illness  01/22/2015 1st [facility] office visit/ [name]   Chief Complaint  Patient presents with  . Pulmonary Consult    Referred by [name], PA for eval of COPD. Pt c/o cough and SOB- progressively worse over the past year. He is SOB sometimes just sitting and with minimal exertion. Cough is prod with large amounts of sputum "sometimes clear sometimes dark".   s/p L lower lowb partial lobectomy  2008 : Adenoca  pT2, pN2, pMX  s/p RT and chemo and no doe until around 2015 on acei with indolent onset  Progressively worse  sob > slow and flat ok but  Sometimes can't even walk at a nl pace x 10 ft but varies a lot s pattern/ sometimes sob at rest/ no better with inhalers in past / assoc with severe coughing fits more day than night. rec Prednisone 10 mg take  4 each am x 2 days,   2 each am x 2 days,  1 each am x 2 days and stop  Stop lisinopril Start benicar 20 one daily  The key is to stop smoking completely before smoking completely stops you!     03/13/2015 f/u ov/[name] re: copd GOLD IV no maint rx/ no saba  Chief Complaint  Patient presents with  . Follow-up    PFT done today. Pt states his breathing is much improved.     Not limited by breathing from desired activities  But very inactive >change coreg to bisoprolol       09/25/2017   extended ov/[name] re: re- establish re GOLD IV copd / now hypoxemic  Chief Complaint  Patient presents with  . Follow-up    went to see cards on 09/14/17- sats low and o2 was ordered but insurance will not cover due to "not trying other methods for  COPD".  He states has good days and bad days with breathing.    gradually downhill x 4 months esp noct sob  MMRC2 = can't walk a nl pace on a flat grade s sob but does fine slow and flat eg [employer] s 02  Sleeps horizontal with sob when lies down x up to 2h  eventually able to sleep x 4 m with minimal am rattling > minimal mucoid sputum  rec Start Trelegy one click each am  Start 2lpm 24/7 and no smoking when 02 is running      10/26/2017  f/u ov/[name] re: updated COPD II criteria/ 02 2lpm hs not using daytime  Chief Complaint  Patient presents with  . Follow-up    PFT's done today. Breathing is better and he is coughing much less.    Dyspnea:  MMRC2 = can't walk a nl pace on a flat grade s sob but does fine slow and flat  Cough: am rattling  Sleep: ok 2lpm flat SABA use:   Rec:  No change Trelegy The key is to stop smoking completely before smoking completely stops you!    07/16/2018  f/u ov/[name] re: copd II/ trelegy maint rx  Chief Complaint  Patient presents with  . Follow-up    Breathing is "doing fine".  He does not have a rescue inhaler.   Dyspnea:  MMRC2 = can't walk a nl pace on a flat grade s sob but does fine slow and flat Cough: min mucoid /worse thing in am Sleeping: L side down / one pillow / 4 in block elevaation  SABA use: none 02: 2lpm hs  And prn otherwise   rec No change rx   07/07/2020  f/u ov/[name] re:  GOLD II/ noct 02 - sob  worse  X  One month/ still smoking  Chief Complaint  Patient presents with  . Follow-up    Patient has increased shortness of breath, wears oxygen at night  Dyspnea: no longer walking much - "just did mb and back the other day"  ie he is very sedentary  Cough: worse rattling / no color to mucus but worse congestion in am   Sleeping: sleeping prone, bed is flat,   one pillow  SABA use: proair not using  02: 2lpm hs / prn daytime    No obvious day to day or daytime variability or assoc excess/ purulent sputum or mucus  plugs or hemoptysis or cp or chest tightness, subjective wheeze or overt sinus or hb symptoms.   Sleeping  without nocturn  exacerbation  of respiratory  c/o's or need for noct saba. Also denies any obvious fluctuation of symptoms with weather or environmental changes or other aggravating or alleviating factors except as outlined above   No unusual exposure hx or h/o childhood pna/ asthma or knowledge of premature birth.  Current Allergies, Complete Past Medical History, Past Surgical History, Family History, and Social History were reviewed in [employer] record.  ROS  The following are not active complaints unless bolded Hoarseness, sore throat, dysphagia, dental problems, itching, sneezing,  nasal congestion or discharge of excess mucus or purulent secretions, ear ache,   fever, chills, sweats, unintended wt loss or wt gain, classically pleuritic or exertional cp,  orthopnea pnd or arm/hand swelling  or leg swelling, presyncope, palpitations, abdominal pain, anorexia, nausea, vomiting, diarrhea  or change in bowel habits or change in bladder habits, change in stools or change in urine, dysuria, hematuria,  rash, arthralgias, visual complaints, headache, numbness, weakness or ataxia or problems with walking or coordination,  change in mood or  memory.        Current Meds  Medication Sig  . amLODipine (NORVASC) 5 MG tablet Take 1 tablet (5 mg total) by mouth daily.  [name] aspirin 81 MG tablet Take 81 mg by mouth daily.    [name] atorvastatin (LIPITOR) 40 MG tablet Take 1 tablet (40 mg total) by mouth daily.  . bisoprolol (ZEBETA) 5 MG tablet Take 1 tablet (5 mg total) by mouth daily.  . cetirizine (ZYRTEC) 10 MG tablet Take 10 mg by mouth daily.    . Multiple Vitamin (ONE-A-DAY MENS PO) Take by mouth daily.  [name] olmesartan (BENICAR) 40 MG tablet TAKE 1/2 TABLET BY MOUTH ONCE A DAY  . OXYGEN DME- [facility]  2lpm with sleep  . TRELEGY ELLIPTA 100-62.5-25 MCG/INH AEPB INHALE 1 PUFF BY  MOUTH EVERY DAY                     Objective:   Physical Exam  Elderly chronically ill amb wm somewhat choreo athetoid movements     07/07/2020  105  07/16/2018  118  01/26/2018  116  10/26/2017  114  03/13/2015 108 >112 04/03/2015 > 07/17/2015 115 > 09/25/2017  108        Vital signs reviewed  07/07/2020  - Note at rest 02 sats  87% on 3lpm POC on arrival, 95% p rested     HEENT : pt wearing mask not removed for exam due to covid -19 concerns.    NECK :  without JVD/Nodes/TM/ nl carotid upstrokes bilaterally   LUNGS: no acc muscle use,  Mod barrel  contour chest wall with bilateral  Distant bs s audible wheeze and  without cough on insp or exp maneuvers and mod  Hyperresonant  to  percussion bilaterally     CV:  RRR  no s3 or murmur or increase in P2, and no edema   ABD:  soft and nontender with pos mid insp Hoover's  in the supine position. No bruits or organomegaly appreciated, bowel sounds nl  MS:     ext warm without deformities, calf tenderness, cyanosis or clubbing No obvious joint restrictions   SKIN: warm and dry without lesions    NEURO:  alert, approp, nl sensorium with  no motor or cerebellar deficits apparent.        CXR PA and Lateral:   07/07/2020 :    I personally reviewed images and agree with radiology impression as follows:   Appearance of the lungs is concerning for severe bronchitis and multilobar bronchopneumonia, as above   Labs ordered/ reviewed:      Chemistry      Component Value Date/Time   NA 138 07/07/2020 0934   NA 137 12/04/2009 1312   K 3.8 07/07/2020 0934   K 4.3 12/04/2009 1312   CL 97 07/07/2020 0934   CL 97 (L) 12/04/2009 1312   CO2 33 (H) 07/07/2020 0934   CO2 29 12/04/2009 1312   BUN 8 07/07/2020 0934   BUN 16 12/04/2009 1312   CREATININE 1.02 07/07/2020 0934   CREATININE 1.1 12/04/2009 1312      Component Value Date/Time   CALCIUM 9.4 07/07/2020 0934   CALCIUM 9.5 12/04/2009 1312   ALKPHOS 63 12/22/2014 1242    ALKPHOS 63 12/04/2009 1312   AST 27 12/22/2014 1242   AST 27 12/04/2009 1312   ALT 14 12/22/2014 1242   ALT 14 12/04/2009 1312   BILITOT 0.5 12/22/2014 1242   BILITOT 0.70 12/04/2009 1312        Lab Results  Component Value Date   WBC 9.1 07/07/2020   HGB 15.0 07/07/2020   HCT 45.4 07/07/2020   MCV 96.1 07/07/2020   PLT 263.0 07/07/2020       EOS                                                               0.1                                    07/07/2020    Lab Results  Component Value Date   TSH 1.14 09/25/2017     Lab Results  Component Value Date   PROBNP 139.0 (H) 09/25/2017  Labs ordered 07/07/2020  :   alpha one AT phenotype                   Assessment & Plan:

## 2020-07-07 NOTE — Telephone Encounter (Signed)
ATC patient to let kim know about RX sent to pharmacy. Per DPR left detailed message letting him know. Advised patient to call with any questions or concerns. Nothing further needed at this time.

## 2020-07-07 NOTE — Assessment & Plan Note (Signed)
rec 24h 2019   02 sats 83% at rest 09/25/2017 rec 2lpm 24/7  - 10/26/2017 sats 90% at rest RA on trelegy so rec 2lpm hs and POC daytime titrate to keep sats > 90%  - walking titration required 5 lpm POC to keep sats > 90% on 10/30/17 HC03  07/07/2020  =  33   rec titrate 02 to low 30/s  Avoid cigs as much as possible as a matter of life or breath

## 2020-07-07 NOTE — Assessment & Plan Note (Signed)

## 2020-07-07 NOTE — Telephone Encounter (Signed)
Let him know I wrote for levaquin 500 mg daily x 10 days  - call if any problem getting this as he may have pneumonia

## 2020-07-07 NOTE — Assessment & Plan Note (Addendum)
Active smoker  - trial off acei 01/23/2015 > improved  - PFTs 03/13/2015  FEV1  0.63 (28%) ratio 44 p 13% better from saba and dlco 24 corrects to 40 for alv vol - 03/14/2015 trial off coreg and on bisoprolol > improved  - 09/25/2017  After extensive coaching inhaler device  effectiveness =    90% with dpi/ elipta so rec trial of trelegy and f/u pfts in 4 weeks  - Allergy profile 09/25/17  >  Eos 0.1 /  IgE  102 RAST neg - PFT's  10/26/2017  FEV1 1.21 (56 % ) ratio 53  p 14  % improvement from saba p trelegy prior to study with DLCO  34/33 % corrects to 51  % for alv volume    Flare in setting ongoing smoking with ? Superimposed pulmonary infiltrates ? Etiology (see separate a/p)  For today rec No change maint rx Prednisone 10 mg take  4 each am x 2 days,   2 each am x 2 days,  1 each am x 2 days and stop  levaquin 500 mg daily x 10 days then ov with cxr  reviwed prn saba using proair respiclick trainer   Re saba: I spent extra time with pt today reviewing appropriate use of albuterol for prn use on exertion with the following points: 1) saba is for relief of sob that does not improve by walking a slower pace or resting but rather if the pt does not improve after trying this first. 2) If the pt is convinced, as many are, that saba helps recover from activity faster then it's easy to tell if this is the case by re-challenging : ie stop, take the inhaler, then p 5 minutes try the exact same activity (intensity of workload) that just caused the symptoms and see if they are substantially diminished or not after saba 3) if there is an activity that reproducibly causes the symptoms, try the saba 15 min before the activity on alternate days   If in fact the saba really does help, then fine to continue to use it prn but advised may need to look closer at the maintenance regimen being used to achieve better control of airways disease with exertion.

## 2020-07-07 NOTE — Assessment & Plan Note (Signed)
See cxr 07/07/2020  -  07/07/2020 rec levaquin 500 mg daily x 10 days the ov with cxr, to er if worse   Miscellaneous:Alv microlithiasis, alv proteinosis, asp, bronchiectais rx levaquin , BOOP  rx pred short term ARDS/ AIP Occupational dz/ HSP - needs serology on return  Neoplasm ? lymphagitic carcinomatosis  Infection -  Vaccinated for covid 6 weeks ago so doubt it but assoc with increasing cough / congestion c/w multilabar pna > levaquin empirically x 10 days rec Drug  None of the usual suspects listed  Pulmonary emboli, Protein disorders Edema no peripheral edema or CM to support  Eosinophilic dz - none peripherally so less likely  Sarcoidosis Connective tissue dz - no arthritis to suggest - consider screen on return  Hist X or RBILD possible in active smoker > strongly rec stop all smoking   Hemorrhage- no anemia or hemoptysis to suggest it  Idiopathic

## 2020-07-27 LAB — ALPHA-1 ANTITRYPSIN PHENOTYPE: A-1 Antitrypsin, Ser: 202 mg/dL — ABNORMAL HIGH (ref 83–199)

## 2020-07-28 ENCOUNTER — Other Ambulatory Visit: Payer: Self-pay

## 2020-07-28 ENCOUNTER — Other Ambulatory Visit: Payer: Self-pay | Admitting: Internal Medicine

## 2020-07-29 MED ORDER — TRELEGY ELLIPTA 100-62.5-25 MCG/INH IN AEPB: 1.0000 | INHALATION_SPRAY | Freq: Every day | RESPIRATORY_TRACT | 11 refills | Status: AC

## 2020-09-02 ENCOUNTER — Ambulatory Visit (HOSPITAL_COMMUNITY)
Admission: RE | Admit: 2020-09-02 | Discharge: 2020-09-02 | Disposition: A | Discharge status: Home / Self Care | Payer: Medicare Other | Source: Ambulatory Visit | Attending: Physician Assistant | Admitting: Physician Assistant

## 2020-09-02 ENCOUNTER — Other Ambulatory Visit: Payer: Self-pay

## 2020-09-02 ENCOUNTER — Ambulatory Visit: Payer: Medicare Other | Admitting: Vascular Surgery

## 2020-09-02 DIAGNOSIS — I714 Abdominal aortic aneurysm, without rupture, unspecified: Secondary | ICD-10-CM

## 2020-09-09 ENCOUNTER — Ambulatory Visit: Payer: Medicare Other | Admitting: Vascular Surgery

## 2020-09-28 ENCOUNTER — Encounter: Payer: Self-pay | Admitting: Internal Medicine

## 2020-09-30 ENCOUNTER — Encounter: Payer: Self-pay | Admitting: Vascular Surgery

## 2020-09-30 ENCOUNTER — Other Ambulatory Visit: Payer: Self-pay

## 2020-09-30 ENCOUNTER — Ambulatory Visit (INDEPENDENT_AMBULATORY_CARE_PROVIDER_SITE_OTHER): Payer: Medicare Other | Admitting: Vascular Surgery

## 2020-09-30 VITALS — BP 145/79 | HR 69 | Temp 97.6°F | Resp 20 | Ht 65.0 in | Wt 99.0 lb

## 2020-09-30 DIAGNOSIS — I714 Abdominal aortic aneurysm, without rupture, unspecified: Secondary | ICD-10-CM

## 2020-09-30 NOTE — Progress Notes (Signed)
REASON FOR VISIT:   Follow-up of juxtarenal abdominal aortic aneurysm  MEDICAL ISSUES:   JUXTARENAL ABDOMINAL AORTIC ANEURYSM: There has been no significant change in the size of his 5.9 cm juxtarenal abdominal aortic aneurysm.  I think the only option for repair would be a thoracoabdominal approach.  Given his debilitated state and pulmonary status on continual O2 I simply do not think he would tolerate open surgery.  He understands that.  We have again discussed the importance of tobacco cessation.  He states that he has been smoking his entire life and that he really does not have any desire to quit.  His blood pressures under good control.  I think it would be reasonable to stretch his follow-up out to 1 year.  I have ordered her ultrasound at that time.  He knows to call sooner if he has problems.   HPI:   Derek Jordan is a pleasant 87 y.o. male who I last saw on 12/25/2019 with a juxtarenal abdominal aortic aneurysm. This was 5.9 cm in maximum diameter. The aneurysm extended up to the level of the renal arteries. The celiac axis and superior mesenteric artery originated very close to the origin of the renal arteries. Thus there was no place to place a clamp below the mesenteric's. In addition above the celiac axis the aorta is significantly calcified. The left external iliac artery was occluded. He was clearly not a candidate for endovascular repair. Noted I think he would be a good candidate for a fenestrated graft. He was short of breath at rest and was significantly debilitated. I was not sure that he was a candidate for open repair. We discussed importance of tobacco cessation. The only other consideration would be referring him to [facility] to consider a thoracoabdominal approach. I set him up for an ultrasound in 6 months and he comes in for that visit.  Of note this patient also has a history of adenocarcinoma of the lung.  He underwent a left lower lobe partial lobectomy in 2008.   He is also undergone radiation therapy and chemo.  Since I saw him last, he denies any history of abdominal pain or back pain.  He denies any claudication.  However, I think his activity is definitely limited by his pulmonary status.  He short of breath at rest and requires continual O2 essentially.  He denies any history of rest pain.  He said no nonhealing ulcers.    Past Medical History:  Diagnosis Date  . AAA (abdominal aortic aneurysm) ([place])    a. Abd [place] 6/16:  3.9 x 4.4 cm with some intraluminal thrombus, chronic L EIA occlusion >> FU 6 mos  . Adenocarcinoma ([place])    Lung CA s/p resection, chemoTx, XR Tx  . CAD (coronary artery disease)    a. LHC 11/05 at time of MI:  pLAD 40, MOM 40, mRCA 99, EF 60% >> 3.5 x 28 mm Cypher DES to mRCA;  b. Myoview 6/16:  normal perfusion, EF 57%  . Carotid artery stenosis    a. Carotid US 5/12:  bilat 0-39%;  b. Carotid US 12/16:  Bilateral ICA 1-39%  . COPD (chronic obstructive pulmonary disease) (H[facility])    Dr. Melvyn Novas  . History of cardiac monitoring    Event Monitor 6/16:  NSR, PVCs, no A. fib noted, no significant pauses  . History of echocardiogram    Echo 6/16: Mild LVH, EF 55-60%, normal wall motion, grade 1 diastolic dysfunction, trivial TR, PASP  39 mmHg  . HTN (hypertension)   . Hx of radiation therapy   . Hyperlipidemia   . Mediastinal lymphadenopathy   . Myocardial infarct [facility]) 06/2004   s/p Cypher DES to RCA  . PVD (peripheral vascular disease) ([place])   . Renal artery stenosis (H[facility])    a. hx of L RAS > 60%  . Thyroid nodule   . Tobacco abuse     Family History  Problem Relation Age of Onset  . Diabetes Mother   . Heart failure Father   . Hypertension Father   . Heart attack Father   . Heart attack Other   . Stroke Sister   . Lung cancer Brother        smoked    SOCIAL HISTORY: Social History   Tobacco Use  . Smoking status: Current Every Day Smoker    Packs/day: 1.00    Years: 75.00    Pack years: 75.00    Types:  Cigarettes  . Smokeless tobacco: Never Used  . Tobacco comment: 15 cigarettes a day  Substance Use Topics  . Alcohol use: No    Alcohol/week: 0.0 standard drinks    No Known Allergies  Current Outpatient Medications  Medication Sig Dispense Refill  . Albuterol Sulfate (PROAIR RESPICLICK) 510 (90 Base) MCG/ACT AEPB Inhale 1-2 puffs into the lungs every 4 (four) hours as needed. 1 each 11  . aspirin 81 MG tablet Take 81 mg by mouth daily.    [name] atorvastatin (LIPITOR) 40 MG tablet Take 1 tablet (40 mg total) by mouth daily. 90 tablet 3  . bisoprolol (ZEBETA) 5 MG tablet Take 1 tablet (5 mg total) by mouth daily. 30 tablet 11  . cetirizine (ZYRTEC) 10 MG tablet Take 10 mg by mouth daily.    . Fluticasone-Umeclidin-Vilant (TRELEGY ELLIPTA) 100-62.5-25 MCG/INH AEPB Inhale 1 puff into the lungs daily. 60 each 11  . levofloxacin (LEVAQUIN) 500 MG tablet Take 1 tablet (500 mg total) by mouth daily. 10 tablet 0  . Multiple Vitamin (ONE-A-DAY MENS PO) Take by mouth daily.    [name] olmesartan (BENICAR) 40 MG tablet TAKE 1/2 TABLET BY MOUTH ONCE A DAY 15 tablet 3  . OXYGEN DME- AHC  2lpm with sleep    . predniSONE (DELTASONE) 10 MG tablet Take  4 each am x 2 days,   2 each am x 2 days,  1 each am x 2 days and stop 14 tablet 0  . TRELEGY ELLIPTA 100-62.5-25 MCG/INH AEPB INHALE 1 PUFF BY MOUTH EVERY DAY 60 each 11  . amLODipine (NORVASC) 5 MG tablet Take 1 tablet (5 mg total) by mouth daily. 180 tablet 2   No current facility-administered medications for this visit.    REVIEW OF SYSTEMS:  [X]  denotes positive finding, [ ]  denotes negative finding Cardiac  Comments:  Chest pain or chest pressure:    Shortness of breath upon exertion: x   Short of breath when lying flat: x   Irregular heart rhythm:        Vascular    Pain in calf, thigh, or hip brought on by ambulation:    Pain in feet at night that wakes you up from your sleep:     Blood clot in your veins:    Leg swelling:         Pulmonary     Oxygen at home:    Productive cough:     Wheezing:         Neurologic  Sudden weakness in arms or legs:     Sudden numbness in arms or legs:     Sudden onset of difficulty speaking or slurred speech:    Temporary loss of vision in one eye:     Problems with dizziness:         Gastrointestinal    Blood in stool:     Vomited blood:         Genitourinary    Burning when urinating:     Blood in urine:        Psychiatric    Major depression:         Hematologic    Bleeding problems:    Problems with blood clotting too easily:        Skin    Rashes or ulcers:        Constitutional    Fever or chills:     PHYSICAL EXAM:   Vitals:   09/30/20 0906  BP: (!) 145/79  Pulse: 69  Resp: 20  Temp: 97.6 F (36.4 C)  SpO2: (!) 89%  Weight: 44.9 kg  Height: 5\' 5"  (1.651 m)   Body mass index is 16.47 kg/m.  GENERAL: The patient is a poorly-nourished male, in no acute distress. The vital signs are documented above. CARDIAC: There is a regular rate and rhythm.  VASCULAR: I do not detect carotid bruits. On the right side he has a palpable femoral and dorsalis pedis pulse. On the left side I cannot palpate a femoral or pedal pulses. PULMONARY: There is good air exchange bilaterally without wheezing or rales. ABDOMEN: Soft and non-tender with normal pitched bowel sounds.  His aneurysm is easily palpable. MUSCULOSKELETAL: There are no major deformities or cyanosis. NEUROLOGIC: No focal weakness or paresthesias are detected. SKIN: There are no ulcers or rashes noted. PSYCHIATRIC: The patient has a normal affect.  DATA:    DUPLEX ABDOMINAL AORTA: I have reviewed the ultrasound of his aorta that was performed on 09/02/2020. This showed the maximum diameter of his aneurysm was 5.9 cm and not had not changed since May of 2021. The right common iliac artery measures 1.4 cm in maximum diameter. The left common iliac artery measures 1.0 cm in maximum.  LABS: I reviewed his labs from  07/07/2020. This showed a GFR greater than 60. Creatinine was 1.02.  Deitra Mayo [facility] [phone]

## 2020-10-05 ENCOUNTER — Ambulatory Visit: Payer: Medicare Other | Admitting: Internal Medicine

## 2020-10-18 NOTE — Progress Notes (Signed)
Cardiology Office Note   Date:  10/19/2020   ID:  Derek Jordan, Derek Jordan 07/01/1933, MRN [id number]  PCP:  Cyndi Bender, PA-C    No chief complaint on file.  CAD  Wt Readings from Last 3 Encounters:  10/19/20 100 lb (45.4 kg)  09/30/20 99 lb (44.9 kg)  07/07/20 105 lb (47.6 kg)       History of Present Illness: Derek Jordan is a 87 y.o. male  with O2 dependent COPD, and a history of single-vessel coronary artery disease, status post acute inferior wall myocardial infarction treated with Cypher drug-eluting stent to the mid right coronary artery in 2005, 3.5 x 28 mm.He passed out at that time.  His LV function was normal. There was mild noncritical disease elsewhere. He also has a history of lung CA (s/p resection/chemo/XRT), hyperlipidemia, severe peripheral vascular disease with a small infrarenal abdominal aneurysm, carotid stenosis and greater than 60% left renal artery stenosis by ultrasound.  His sx was syncope with the MI.He was moving a load of hay.  He has smoked for many years.He has had home O2 in the past.As of 2020, he is using oxygen at night.   An inhaler , Trelegy has helped his breathing, but still has chronic dyspnea on exertion.  Per Dr. Scot Dock in March 2022: "JUXTARENAL ABDOMINAL AORTIC ANEURYSM: There has been no significant change in the size of his 5.9 cm juxtarenal abdominal aortic aneurysm.  I think the only option for repair would be a thoracoabdominal approach.  Given his debilitated state and pulmonary status on continual O2 I simply do not think he would tolerate open surgery.  He understands that."  Now wearing supplemental oxygen all of the time, even when he is on a tractor. Activities have decreased significantly.   Denies : Chest pain. Dizziness. Leg edema. Nitroglycerin use. Orthopnea. Palpitations. Paroxysmal nocturnal dyspnea. Syncope.   Breathing is no worse recently.   Past Medical History:  Diagnosis Date  . AAA  (abdominal aortic aneurysm) ([place])    a. Abd [place] 6/16:  3.9 x 4.4 cm with some intraluminal thrombus, chronic L EIA occlusion >> FU 6 mos  . Adenocarcinoma ([place])    Lung CA s/p resection, chemoTx, XR Tx  . CAD (coronary artery disease)    a. LHC 11/05 at time of MI:  pLAD 40, MOM 40, mRCA 99, EF 60% >> 3.5 x 28 mm Cypher DES to mRCA;  b. Myoview 6/16:  normal perfusion, EF 57%  . Carotid artery stenosis    a. Carotid US 5/12:  bilat 0-39%;  b. Carotid US 12/16:  Bilateral ICA 1-39%  . COPD (chronic obstructive pulmonary disease) (HCC)    Dr. Melvyn Novas  . History of cardiac monitoring    Event Monitor 6/16:  NSR, PVCs, no A. fib noted, no significant pauses  . History of echocardiogram    Echo 6/16: Mild LVH, EF 55-60%, normal wall motion, grade 1 diastolic dysfunction, trivial TR, PASP 39 mmHg  . HTN (hypertension)   . Hx of radiation therapy   . Hyperlipidemia   . Mediastinal lymphadenopathy   . Myocardial infarct [facility]) 06/2004   s/p Cypher DES to RCA  . PVD (peripheral vascular disease) ([place])   . Renal artery stenosis (HCC)    a. hx of L RAS > 60%  . Thyroid nodule   . Tobacco abuse     Past Surgical History:  Procedure Laterality Date  . BIOPSY THYROID  03-13-2007  . CORONARY ARTERY  DISEASE, S/P PTCA (06-09-2004)    . THORACOSCOPIC SURGICAL,WEDGE RESECTION (05-01-2007)    . TONSILLECTOMY       Current Outpatient Medications  Medication Sig Dispense Refill  . Albuterol Sulfate (PROAIR RESPICLICK) 384 (90 Base) MCG/ACT AEPB Inhale 1-2 puffs into the lungs every 4 (four) hours as needed. 1 each 11  . amLODipine (NORVASC) 5 MG tablet Take 1 tablet (5 mg total) by mouth daily. 180 tablet 2  . aspirin 81 MG tablet Take 81 mg by mouth daily.    [name] atorvastatin (LIPITOR) 40 MG tablet Take 1 tablet (40 mg total) by mouth daily. 90 tablet 3  . bisoprolol (ZEBETA) 5 MG tablet Take 1 tablet (5 mg total) by mouth daily. 30 tablet 11  . cetirizine (ZYRTEC) 10 MG tablet Take 10 mg by mouth daily.     . Fluticasone-Umeclidin-Vilant (TRELEGY ELLIPTA) 100-62.5-25 MCG/INH AEPB Inhale 1 puff into the lungs daily. 60 each 11  . levofloxacin (LEVAQUIN) 500 MG tablet Take 1 tablet (500 mg total) by mouth daily. 10 tablet 0  . Multiple Vitamin (ONE-A-DAY MENS PO) Take by mouth daily.    [name] olmesartan (BENICAR) 40 MG tablet TAKE 1/2 TABLET BY MOUTH ONCE A DAY 15 tablet 3  . OXYGEN DME- [facility]  2lpm with sleep    . TRELEGY ELLIPTA 100-62.5-25 MCG/INH AEPB INHALE 1 PUFF BY MOUTH EVERY DAY 60 each 11   No current facility-administered medications for this visit.    Allergies:   Patient has no known allergies.    Social History:  The patient  reports that he has been smoking cigarettes. He has a 75.00 pack-year smoking history. He has never used smokeless tobacco. He reports that he does not drink alcohol and does not use drugs.   Family History:  The patient's family history includes Diabetes in his mother; Heart attack in his father and another family member; Heart failure in his father; Hypertension in his father; Lung cancer in his brother; Stroke in his sister.    ROS:  Please see the history of present illness.   Otherwise, review of systems are positive for DOE.   All other systems are reviewed and negative.    PHYSICAL EXAM: VS:  BP (!) 142/72   Pulse 66   Ht 5\' 5"  (1.651 m)   Wt 100 lb (45.4 kg)   SpO2 90%   BMI 16.64 kg/m  , BMI Body mass index is 16.64 kg/m. GEN: Well nourished, well developed, frail HEENT: normal  Neck: no JVD, carotid bruits, or masses Cardiac: RRR; no murmurs, rubs, or gallops,no edema  Respiratory:  clear to auscultation bilaterally, normal work of breathing GI: soft, nontender, nondistended, + BS MS: no deformity or atrophy  Skin: warm and dry, no rash Neuro:  Strength and sensation are intact Psych: euthymic mood, full affect   EKG:   The ekg ordered today demonstrates NSR, repol abnormality, inferior downsloping ST depression- unchanged from  prior   Recent Labs: 07/07/2020: BUN 8; Creatinine, Ser 1.02; Hemoglobin 15.0; Platelets 263.0; Potassium 3.8; Sodium 138   Lipid Panel    Component Value Date/Time   CHOL 111 12/22/2014 1242   TRIG 41.0 12/22/2014 1242   HDL 48.40 12/22/2014 1242   CHOLHDL 2 12/22/2014 1242   VLDL 8.2 12/22/2014 1242   LDLCALC 54 12/22/2014 1242     Other studies Reviewed: Additional studies/ records that were reviewed today with results demonstrating: records reviewed.  LDL 58 in 2021.   ASSESSMENT AND PLAN:  1. CAD/old MI: Status post RCA drug-eluting stent at the time of his inferior MI many years ago.  Continue aggressive secondary prevention.  Chronic shortness of breath from lung disease noted. No angina.  Continue to use supplemental oxygen. 2. Hypertension: Low sodium diet.  Avoid processed foods.  Eats out a fair amount.  The current medical regimen is effective;  continue present plan and medications. 3. Tobacco abuse: He does not want to quit. 4. AAA: As noted by Dr. Lou Cal.  Complex anatomy that would require open surgery for which the patient is not thought to be a candidate.  Continue statin.   Current medicines are reviewed at length with the patient today.  The patient concerns regarding his medicines were addressed.  The following changes have been made:  No change  Labs/ tests ordered today include:  No orders of the defined types were placed in this encounter.   Recommend 150 minutes/week of aerobic exercise Low fat, low carb, high fiber diet recommended  Disposition:   FU in 1 year   Signed, Larae Grooms, MD  10/19/2020 9:10 AM    [address], [place], [place]  [address] Phone: [phone]; Fax: [phone]

## 2020-10-19 ENCOUNTER — Other Ambulatory Visit: Payer: Self-pay

## 2020-10-19 ENCOUNTER — Encounter: Payer: Self-pay | Admitting: Interventional Cardiology

## 2020-10-19 ENCOUNTER — Ambulatory Visit (INDEPENDENT_AMBULATORY_CARE_PROVIDER_SITE_OTHER): Payer: Medicare Other | Admitting: Interventional Cardiology

## 2020-10-19 ENCOUNTER — Ambulatory Visit (INDEPENDENT_AMBULATORY_CARE_PROVIDER_SITE_OTHER): Payer: Medicare Other | Admitting: Acute Care

## 2020-10-19 ENCOUNTER — Encounter: Payer: Self-pay | Admitting: Acute Care

## 2020-10-19 VITALS — BP 142/72 | HR 66 | Ht 65.0 in | Wt 100.0 lb

## 2020-10-19 VITALS — BP 122/62 | HR 61 | Temp 97.3°F | Ht 64.5 in | Wt 99.8 lb

## 2020-10-19 DIAGNOSIS — G4734 Idiopathic sleep related nonobstructive alveolar hypoventilation: Secondary | ICD-10-CM | POA: Diagnosis not present

## 2020-10-19 DIAGNOSIS — Z720 Tobacco use: Secondary | ICD-10-CM

## 2020-10-19 DIAGNOSIS — J441 Chronic obstructive pulmonary disease with (acute) exacerbation: Secondary | ICD-10-CM

## 2020-10-19 DIAGNOSIS — I714 Abdominal aortic aneurysm, without rupture, unspecified: Secondary | ICD-10-CM

## 2020-10-19 DIAGNOSIS — I25118 Atherosclerotic heart disease of native coronary artery with other forms of angina pectoris: Secondary | ICD-10-CM

## 2020-10-19 DIAGNOSIS — I10 Essential (primary) hypertension: Secondary | ICD-10-CM

## 2020-10-19 DIAGNOSIS — F17210 Nicotine dependence, cigarettes, uncomplicated: Secondary | ICD-10-CM

## 2020-10-19 DIAGNOSIS — I252 Old myocardial infarction: Secondary | ICD-10-CM | POA: Diagnosis not present

## 2020-10-19 MED ORDER — TRELEGY ELLIPTA 100-62.5-25 MCG/INH IN AEPB: 1.0000 | INHALATION_SPRAY | Freq: Every day | RESPIRATORY_TRACT | 0 refills | Status: AC

## 2020-10-19 NOTE — Patient Instructions (Signed)
Medication Instructions:  Your physician recommends that you continue on your current medications as directed. Please refer to the Current Medication list given to you today.  *If you need a refill on your cardiac medications before your next appointment, please call your pharmacy*   Lab Work: none If you have labs (blood work) drawn today and your tests are completely normal, you will receive your results only by: [name] MyChart Message (if you have MyChart) OR . A paper copy in the mail If you have any lab test that is abnormal or we need to change your treatment, we will call you to review the results.   Testing/Procedures: none   Follow-Up: At [facility], you and your health needs are our priority.  As part of our continuing mission to provide you with exceptional heart care, we have created designated Provider Care Teams.  These Care Teams include your primary Cardiologist (physician) and Advanced Practice Providers (APPs -  Physician Assistants and Nurse Practitioners) who all work together to provide you with the care you need, when you need it.  We recommend signing up for the patient portal called "MyChart".  Sign up information is provided on this After Visit Summary.  MyChart is used to connect with patients for Virtual Visits (Telemedicine).  Patients are able to view lab/test results, encounter notes, upcoming appointments, etc.  Non-urgent messages can be sent to your provider as well.   To learn more about what you can do with MyChart, go to [email or web address].    Your next appointment:   12 month(s)  The format for your next appointment:   In Person  Provider:   You may see Larae Grooms, MD or one of the following Advanced Practice Providers on your designated Care Team:    Melina Copa, PA-C  Ermalinda Barrios, PA-C    Other Instructions May try Ensure or Boost to help with nutrition intake.  You can buy these at the pharmacy or grocery store

## 2020-10-19 NOTE — Progress Notes (Signed)
History of Present Illness Derek Jordan is a 87 y.o. male current every day smoker on home O2  with COPD.He is followed by Dr. Melvyn Jordan.  Brief patient profile:  89  yowm active smoker widower lives in [place]  /  PCP is Derek Jordan  And  referred by to pulmonary eval 01/22/2015 Derek Jordan for copd eval and proved to have GOLD IV severity 03/13/15 with symptoms than improved off acei and coreg to GOLD II by 10/26/17 .  Maintenance : Trelegy Ellipta Rescue : Pro Air Oxygen 2 L with sleep    10/19/2020   Pt. Last seen by Dr. Melvyn Jordan 07/2020 with CXR that confirmed severe bronchitis and multilobar bronchopneumonia. He was treated with Levquin 500 mg daily x 10 days, and a prednisone taper. This is his 3 month follow up, per Derek Jordan recs. He states he is doing well. He is compliant with his Trelegy. He uses his [employer]ne[employer] as rarely as once a week. He states the air heaviness is what affects his breathing most. He states he has has small amounts of secretions. He states they are clear and on occasion they are cloudy. No fever. He states he gets a significant improvement in his dyspnea with the Trelegy. He states he does have wheezing on occasion. He does have Mucinex at home. He is not on it now. He denies fever, chest pain, orthopnea or hemoptysis.  Pt has lost 5 pounds in the last 3 months. I have discussed using Boost or Ensure as meal supplements. I have encouraged patient to stop smoking. We discussed that unintentional weight loss can be concerning for cancer. He states he feels he is doing well for an old man.    Test Results: Active smoker  - trial off acei 01/23/2015 > improved  - PFTs 03/13/2015  FEV1  0.63 (28%) ratio 44 p 13% better from saba and dlco 24 corrects to 40 for alv vol - 03/14/2015 trial off coreg and on bisoprolol > improved  - 09/25/2017  After extensive coaching inhaler device  effectiveness =    90% with dpi/ elipta so rec trial of trelegy and f/u pfts in 4 weeks  - Allergy  profile 09/25/17  >  Eos 0.1 /  IgE  102 RAST neg - PFT's  10/26/2017  FEV1 1.21 (56 % ) ratio 53  p 14  % improvement from saba p trelegy prior to study with DLCO  34/33 % corrects to 51  % for alv volume    CBC Latest Ref Rng & Units 07/07/2020 09/25/2017 12/22/2014  WBC 4.0 - 10.5 K/uL 9.1 8.3 7.9  Hemoglobin 13.0 - 17.0 g/dL 15.0 16.1 14.0  Hematocrit 39.0 - 52.0 % 45.4 47.5 40.7  Platelets 150.0 - 400.0 K/uL 263.0 261.0 214.0    BMP Latest Ref Rng & Units 07/07/2020 09/25/2017 12/22/2014  Glucose 70 - 99 mg/dL 96 84 82  BUN 6 - 23 mg/dL 8 23 16   Creatinine 0.40 - 1.50 mg/dL 1.02 1.20 0.96  Sodium 135 - 145 mEq/L 138 138 132(L)  Potassium 3.5 - 5.1 mEq/L 3.8 5.0 4.7  Chloride 96 - 112 mEq/L 97 96 97  CO2 19 - 32 mEq/L 33(H) 27 32  Calcium 8.4 - 10.5 mg/dL 9.4 10.3 9.4    BNP No results found for: BNP  ProBNP    Component Value Date/Time   PROBNP 139.0 (H) 09/25/2017 0953    PFT    Component Value Date/Time   FEV1PRE  1.05 10/26/2017 1059   FEV1POST 1.21 10/26/2017 1059   FVCPRE 2.16 10/26/2017 1059   FVCPOST 2.29 10/26/2017 1059   TLC 6.50 10/26/2017 1059   DLCOUNC 8.92 10/26/2017 1059   PREFEV1FVCRT 49 10/26/2017 1059   PSTFEV1FVCRT 53 10/26/2017 1059    No results found.   Past medical hx Past Medical History:  Diagnosis Date  . AAA (abdominal aortic aneurysm) ([place])    a. Abd [place] 6/16:  3.9 x 4.4 cm with some intraluminal thrombus, chronic L EIA occlusion >> FU 6 mos  . Adenocarcinoma ([place])    Lung CA s/p resection, chemoTx, XR Tx  . CAD (coronary artery disease)    a. LHC 11/05 at time of MI:  pLAD 40, MOM 40, mRCA 99, EF 60% >> 3.5 x 28 mm Cypher DES to mRCA;  b. Myoview 6/16:  normal perfusion, EF 57%  . Carotid artery stenosis    a. Carotid US 5/12:  bilat 0-39%;  b. Carotid US 12/16:  Bilateral ICA 1-39%  . COPD (chronic obstructive pulmonary disease) ([facility])    Dr. Melvyn Jordan  . History of cardiac monitoring    Event Monitor 6/16:  NSR, PVCs, no A. fib noted, no  significant pauses  . History of echocardiogram    Echo 6/16: Mild LVH, EF 55-60%, normal wall motion, grade 1 diastolic dysfunction, trivial TR, PASP 39 mmHg  . HTN (hypertension)   . Hx of radiation therapy   . Hyperlipidemia   . Mediastinal lymphadenopathy   . Myocardial infarct [facility]) 06/2004   s/p Cypher DES to RCA  . PVD (peripheral vascular disease) ([place])   . Renal artery stenosis ([facility])    a. hx of L RAS > 60%  . Thyroid nodule   . Tobacco abuse      Social History   Tobacco Use  . Smoking status: Current Every Day Smoker    Packs/day: 1.00    Years: 75.00    Pack years: 75.00    Types: Cigarettes  . Smokeless tobacco: Never Used  . Tobacco comment: 15 cigarettes a day  Vaping Use  . Vaping Use: Never used  Substance Use Topics  . Alcohol use: No    Alcohol/week: 0.0 standard drinks  . Drug use: No    Derek Jordan reports that he has been smoking cigarettes. He has a 75.00 pack-year smoking history. He has never used smokeless tobacco. He reports that he does not drink alcohol and does not use drugs.  Tobacco Cessation: Current every day smoker, smoker 15 cigarettes daily. Counseled on quitting  Past surgical hx, Family hx, Social hx all reviewed.  Current Outpatient Medications on File Prior to Visit  Medication Sig  . Albuterol Sulfate (PROAIR RESPICLICK) 130 (90 Base) MCG/ACT AEPB Inhale 1-2 puffs into the lungs every 4 (four) hours as needed.  [name] aspirin 81 MG tablet Take 81 mg by mouth daily.  [name] atorvastatin (LIPITOR) 40 MG tablet Take 1 tablet (40 mg total) by mouth daily.  . bisoprolol (ZEBETA) 5 MG tablet Take 1 tablet (5 mg total) by mouth daily.  . cetirizine (ZYRTEC) 10 MG tablet Take 10 mg by mouth daily.  . Fluticasone-Umeclidin-Vilant (TRELEGY ELLIPTA) 100-62.5-25 MCG/INH AEPB Inhale 1 puff into the lungs daily.  [name] levofloxacin (LEVAQUIN) 500 MG tablet Take 1 tablet (500 mg total) by mouth daily.  . Multiple Vitamin (ONE-A-DAY MENS PO) Take by mouth  daily.  [name] olmesartan (BENICAR) 40 MG tablet TAKE 1/2 TABLET BY MOUTH ONCE A DAY  .  OXYGEN DME- [facility]  2lpm with sleep  . TRELEGY ELLIPTA 100-62.5-25 MCG/INH AEPB INHALE 1 PUFF BY MOUTH EVERY DAY  . amLODipine (NORVASC) 5 MG tablet Take 1 tablet (5 mg total) by mouth daily.   No current facility-administered medications on file prior to visit.     No Known Allergies  Review Of Systems:  Constitutional:   No  weight loss, night sweats,  Fevers, chills, fatigue, or  lassitude.  HEENT:   No headaches,  Difficulty swallowing,  Tooth/dental problems, or  Sore throat,                No sneezing, itching, ear ache, nasal congestion, post nasal drip,   CV:  No chest pain,  Orthopnea, PND, swelling in lower extremities, anasarca, dizziness, palpitations, syncope.   GI  No heartburn, indigestion, abdominal pain, nausea, vomiting, diarrhea, change in bowel habits, loss of appetite, bloody stools.   Resp: No shortness of breath with exertion or at rest.  No excess mucus, no productive cough,  No non-productive cough,  No coughing up of blood.  No change in color of mucus.  No wheezing.  No chest wall deformity  Skin: no rash or lesions.  GU: no dysuria, change in color of urine, no urgency or frequency.  No flank pain, no hematuria   MS:  No joint pain or swelling.  No decreased range of motion.  No back pain.  Psych:  No change in mood or affect. No depression or anxiety.  No memory loss.   Vital Signs BP 122/62 (BP Location: Right Arm, Cuff Size: Normal)   Pulse 61   Temp (!) 97.3 F (36.3 C) (Oral)   Ht 5' 4.5" (1.638 m)   Wt 99 lb 12.8 oz (45.3 kg)   SpO2 99%   BMI 16.87 kg/m    Physical Exam:  General- No distress,  A&Ox3, pleaant ENT: No sinus tenderness, TM clear, pale nasal mucosa, no oral exudate,no post nasal drip, no LAN Cardiac: S1, S2, regular rate and rhythm, no murmur Chest: No wheeze/ rales/ dullness; no accessory muscle use, no nasal flaring, no sternal  retractions, diminished per bases  Abd.: Soft Non-tender, ND, BS +, Body mass index is 16.87 kg/m. Ext: No clubbing cyanosis, no edema Neuro:  Deconditioned and frail, MAE x 4, A&O x 3 Skin: No rashes, warm and dry, few bruises Psych: normal mood and behavior   Assessment/Plan  COPD Stable interval Plan Continue Trelegy 2 puffs once daily Rinse mouth after use Use ProAir as needed for breakthrough shortness of breath Work on quitting smoking  Do not use oxygen while smoking  Bronchopneumonia 07/2020 Resolved with Levaquin and Pred Taper Plan Follow up for chest congestion, fever., change in color of secretions  Chronic Respiratory Failure with Hypoxemia and Hypercarbia Plan Use oxygen at 2 L at bedtime Do not smoke while wearing oxygen  Tobacco Abuse Plan Counseled on smoking cessation Do not smoke while wearing oxygen   Magdalen Spatz, NP 10/19/2020  10:48 AM

## 2020-10-19 NOTE — Patient Instructions (Signed)
It is good to see you today Continue Trelegy 2 puffs once daily Rinse mouth after use Use ProAir as needed for breakthrough shortness of breath Work on quitting smoking  Follow up for chest congestion, fever., change in color of secretions Use oxygen at 2 L at bedtime Counseled on smoking cessation Add Ensure of Boost supplements for weight gain.  Follow up in 6 months with Judson Roch NP or Dr. Melvyn Novas Please contact office for sooner follow up if symptoms do not improve or worsen or seek emergency care

## 2020-10-19 NOTE — Addendum Note (Signed)
Addended by: Coralie Keens on: 10/19/2020 04:19 PM   Modules accepted: Orders

## 2020-11-23 DIAGNOSIS — J449 Chronic obstructive pulmonary disease, unspecified: Secondary | ICD-10-CM | POA: Diagnosis not present

## 2020-11-23 DIAGNOSIS — R062 Wheezing: Secondary | ICD-10-CM | POA: Diagnosis not present

## 2020-11-23 DIAGNOSIS — J9611 Chronic respiratory failure with hypoxia: Secondary | ICD-10-CM | POA: Diagnosis not present

## 2020-12-23 DIAGNOSIS — J9611 Chronic respiratory failure with hypoxia: Secondary | ICD-10-CM | POA: Diagnosis not present

## 2020-12-23 DIAGNOSIS — R062 Wheezing: Secondary | ICD-10-CM | POA: Diagnosis not present

## 2020-12-23 DIAGNOSIS — J449 Chronic obstructive pulmonary disease, unspecified: Secondary | ICD-10-CM | POA: Diagnosis not present

## 2021-01-23 DIAGNOSIS — R062 Wheezing: Secondary | ICD-10-CM | POA: Diagnosis not present

## 2021-01-23 DIAGNOSIS — J449 Chronic obstructive pulmonary disease, unspecified: Secondary | ICD-10-CM | POA: Diagnosis not present

## 2021-01-23 DIAGNOSIS — J9611 Chronic respiratory failure with hypoxia: Secondary | ICD-10-CM | POA: Diagnosis not present

## 2021-02-22 DIAGNOSIS — J449 Chronic obstructive pulmonary disease, unspecified: Secondary | ICD-10-CM | POA: Diagnosis not present

## 2021-02-22 DIAGNOSIS — J9611 Chronic respiratory failure with hypoxia: Secondary | ICD-10-CM | POA: Diagnosis not present

## 2021-02-22 DIAGNOSIS — R062 Wheezing: Secondary | ICD-10-CM | POA: Diagnosis not present

## 2021-03-25 DIAGNOSIS — J449 Chronic obstructive pulmonary disease, unspecified: Secondary | ICD-10-CM | POA: Diagnosis not present

## 2021-03-25 DIAGNOSIS — J9611 Chronic respiratory failure with hypoxia: Secondary | ICD-10-CM | POA: Diagnosis not present

## 2021-03-25 DIAGNOSIS — R062 Wheezing: Secondary | ICD-10-CM | POA: Diagnosis not present

## 2021-04-25 DIAGNOSIS — J449 Chronic obstructive pulmonary disease, unspecified: Secondary | ICD-10-CM | POA: Diagnosis not present

## 2021-04-25 DIAGNOSIS — J9611 Chronic respiratory failure with hypoxia: Secondary | ICD-10-CM | POA: Diagnosis not present

## 2021-04-25 DIAGNOSIS — R062 Wheezing: Secondary | ICD-10-CM | POA: Diagnosis not present

## 2021-04-28 DIAGNOSIS — I10 Essential (primary) hypertension: Secondary | ICD-10-CM | POA: Diagnosis not present

## 2021-04-28 DIAGNOSIS — E7800 Pure hypercholesterolemia, unspecified: Secondary | ICD-10-CM | POA: Diagnosis not present

## 2021-04-28 DIAGNOSIS — R634 Abnormal weight loss: Secondary | ICD-10-CM | POA: Diagnosis not present

## 2021-04-28 DIAGNOSIS — I714 Abdominal aortic aneurysm, without rupture: Secondary | ICD-10-CM | POA: Diagnosis not present

## 2021-04-28 DIAGNOSIS — J9611 Chronic respiratory failure with hypoxia: Secondary | ICD-10-CM | POA: Diagnosis not present

## 2021-04-28 DIAGNOSIS — Z681 Body mass index (BMI) 19 or less, adult: Secondary | ICD-10-CM | POA: Diagnosis not present

## 2021-04-28 DIAGNOSIS — I2510 Atherosclerotic heart disease of native coronary artery without angina pectoris: Secondary | ICD-10-CM | POA: Diagnosis not present

## 2021-05-25 DIAGNOSIS — J9611 Chronic respiratory failure with hypoxia: Secondary | ICD-10-CM | POA: Diagnosis not present

## 2021-05-25 DIAGNOSIS — J449 Chronic obstructive pulmonary disease, unspecified: Secondary | ICD-10-CM | POA: Diagnosis not present

## 2021-05-25 DIAGNOSIS — R062 Wheezing: Secondary | ICD-10-CM | POA: Diagnosis not present

## 2021-05-31 DIAGNOSIS — R634 Abnormal weight loss: Secondary | ICD-10-CM | POA: Diagnosis not present

## 2021-05-31 DIAGNOSIS — I10 Essential (primary) hypertension: Secondary | ICD-10-CM | POA: Diagnosis not present

## 2021-05-31 DIAGNOSIS — E875 Hyperkalemia: Secondary | ICD-10-CM | POA: Diagnosis not present

## 2021-05-31 DIAGNOSIS — Z681 Body mass index (BMI) 19 or less, adult: Secondary | ICD-10-CM | POA: Diagnosis not present

## 2021-05-31 DIAGNOSIS — J441 Chronic obstructive pulmonary disease with (acute) exacerbation: Secondary | ICD-10-CM | POA: Diagnosis not present

## 2021-05-31 DIAGNOSIS — J9611 Chronic respiratory failure with hypoxia: Secondary | ICD-10-CM | POA: Diagnosis not present

## 2021-07-01 DEATH — deceased
